# Patient Record
Sex: Female | Born: 1985 | Smoking: Current some day smoker
Health system: Southern US, Community
[De-identification: ages and names within clinical notes are randomized; demographics above are authoritative.]

## PROBLEM LIST (undated history)

## (undated) ENCOUNTER — Inpatient Hospital Stay (HOSPITAL_COMMUNITY): Payer: Self-pay

## (undated) DIAGNOSIS — F419 Anxiety disorder, unspecified: Secondary | ICD-10-CM

## (undated) DIAGNOSIS — F329 Major depressive disorder, single episode, unspecified: Secondary | ICD-10-CM

## (undated) DIAGNOSIS — F32A Depression, unspecified: Secondary | ICD-10-CM

## (undated) HISTORY — DX: Major depressive disorder, single episode, unspecified: F32.9

## (undated) HISTORY — DX: Depression, unspecified: F32.A

## (undated) HISTORY — DX: Anxiety disorder, unspecified: F41.9

---

## 2015-01-20 ENCOUNTER — Encounter: Payer: Self-pay | Admitting: *Deleted

## 2015-01-27 ENCOUNTER — Other Ambulatory Visit: Payer: Self-pay | Admitting: Advanced Practice Midwife

## 2015-01-27 ENCOUNTER — Encounter: Payer: Self-pay | Admitting: Advanced Practice Midwife

## 2015-01-27 ENCOUNTER — Ambulatory Visit (INDEPENDENT_AMBULATORY_CARE_PROVIDER_SITE_OTHER): Payer: Self-pay | Admitting: Advanced Practice Midwife

## 2015-01-27 VITALS — BP 116/72 | HR 94 | Wt 219.6 lb

## 2015-01-27 DIAGNOSIS — Z3483 Encounter for supervision of other normal pregnancy, third trimester: Secondary | ICD-10-CM

## 2015-01-27 DIAGNOSIS — O34219 Maternal care for unspecified type scar from previous cesarean delivery: Secondary | ICD-10-CM | POA: Insufficient documentation

## 2015-01-27 DIAGNOSIS — Z3493 Encounter for supervision of normal pregnancy, unspecified, third trimester: Secondary | ICD-10-CM | POA: Insufficient documentation

## 2015-01-27 DIAGNOSIS — Z832 Family history of diseases of the blood and blood-forming organs and certain disorders involving the immune mechanism: Secondary | ICD-10-CM

## 2015-01-27 DIAGNOSIS — Z23 Encounter for immunization: Secondary | ICD-10-CM

## 2015-01-27 DIAGNOSIS — O3421 Maternal care for scar from previous cesarean delivery: Secondary | ICD-10-CM

## 2015-01-27 DIAGNOSIS — F419 Anxiety disorder, unspecified: Secondary | ICD-10-CM | POA: Insufficient documentation

## 2015-01-27 DIAGNOSIS — Z8269 Family history of other diseases of the musculoskeletal system and connective tissue: Secondary | ICD-10-CM

## 2015-01-27 LAB — POCT URINALYSIS DIP (DEVICE)
BILIRUBIN URINE: NEGATIVE
GLUCOSE, UA: NEGATIVE mg/dL
HGB URINE DIPSTICK: NEGATIVE
Ketones, ur: NEGATIVE mg/dL
Nitrite: NEGATIVE
Protein, ur: NEGATIVE mg/dL
SPECIFIC GRAVITY, URINE: 1.02 (ref 1.005–1.030)
Urobilinogen, UA: 1 mg/dL (ref 0.0–1.0)
pH: 7 (ref 5.0–8.0)

## 2015-01-27 LAB — COMPREHENSIVE METABOLIC PANEL
ALBUMIN: 3.6 g/dL (ref 3.5–5.2)
ALK PHOS: 90 U/L (ref 39–117)
ALT: 14 U/L (ref 0–35)
AST: 15 U/L (ref 0–37)
BUN: 5 mg/dL — AB (ref 6–23)
CALCIUM: 9 mg/dL (ref 8.4–10.5)
CHLORIDE: 103 meq/L (ref 96–112)
CO2: 24 meq/L (ref 19–32)
Creat: 0.45 mg/dL — ABNORMAL LOW (ref 0.50–1.10)
Glucose, Bld: 95 mg/dL (ref 70–99)
Potassium: 3.8 mEq/L (ref 3.5–5.3)
Sodium: 138 mEq/L (ref 135–145)
TOTAL PROTEIN: 6.4 g/dL (ref 6.0–8.3)
Total Bilirubin: 0.3 mg/dL (ref 0.2–1.2)

## 2015-01-27 MED ORDER — TETANUS-DIPHTH-ACELL PERTUSSIS 5-2.5-18.5 LF-MCG/0.5 IM SUSP
0.5000 mL | Freq: Once | INTRAMUSCULAR | Status: AC
  Administered 2015-01-27: 0.5 mL via INTRAMUSCULAR

## 2015-01-27 NOTE — Patient Instructions (Signed)
Preterm Labor Information Preterm labor is when labor starts at less than 37 weeks of pregnancy. The normal length of a pregnancy is 39 to 41 weeks. CAUSES Often, there is no identifiable underlying cause as to why a woman goes into preterm labor. One of the most common known causes of preterm labor is infection. Infections of the uterus, cervix, vagina, amniotic sac, bladder, kidney, or even the lungs (pneumonia) can cause labor to start. Other suspected causes of preterm labor include:   Urogenital infections, such as yeast infections and bacterial vaginosis.   Uterine abnormalities (uterine shape, uterine septum, fibroids, or bleeding from the placenta).   A cervix that has been operated on (it may fail to stay closed).   Malformations in the fetus.   Multiple gestations (twins, triplets, and so on).   Breakage of the amniotic sac.  RISK FACTORS 1. Having a previous history of preterm labor.  2. Having premature rupture of membranes (PROM).  3. Having a placenta that covers the opening of the cervix (placenta previa).  4. Having a placenta that separates from the uterus (placental abruption).  5. Having a cervix that is too weak to hold the fetus in the uterus (incompetent cervix).  6. Having too much fluid in the amniotic sac (polyhydramnios).  7. Taking illegal drugs or smoking while pregnant.  8. Not gaining enough weight while pregnant.  9. Being younger than 18 and older than 29 years old.  10. Having a low socioeconomic status.  11. Being African American. SYMPTOMS Signs and symptoms of preterm labor include:   Menstrual-like cramps, abdominal pain, or back pain.  Uterine contractions that are regular, as frequent as six in an hour, regardless of their intensity (may be mild or painful).  Contractions that start on the top of the uterus and spread down to the lower abdomen and back.   A sense of increased pelvic pressure.   A watery or bloody mucus  discharge that comes from the vagina.  TREATMENT Depending on the length of the pregnancy and other circumstances, your health care provider may suggest bed rest. If necessary, there are medicines that can be given to stop contractions and to mature the fetal lungs. If labor happens before 34 weeks of pregnancy, a prolonged hospital stay may be recommended. Treatment depends on the condition of both you and the fetus.  WHAT SHOULD YOU DO IF YOU THINK YOU ARE IN PRETERM LABOR? Call your health care provider right away. You will need to go to the hospital to get checked immediately. HOW CAN YOU PREVENT PRETERM LABOR IN FUTURE PREGNANCIES? You should:   Stop smoking if you smoke.  Maintain healthy weight gain and avoid chemicals and drugs that are not necessary.  Be watchful for any type of infection.  Inform your health care provider if you have a known history of preterm labor. Document Released: 10/14/2003 Document Revised: 03/26/2013 Document Reviewed: 08/26/2012 ExitCare Patient Information 2015 [employer]. This information is not intended to replace advice given to you by your health care provider. Make sure you discuss any questions you have with your health care provider.  Fetal Movement Counts Patient Name: __________________________________________________ Patient Due Date: ____________________ Performing a fetal movement count is highly recommended in high-risk pregnancies, but it is good for every pregnant woman to do. Your health care provider may ask you to start counting fetal movements at 28 weeks of the pregnancy. Fetal movements often increase:  After eating a full meal.  After physical activity.    After eating or drinking something sweet or cold.  At rest. Pay attention to when you feel the baby is most active. This will help you notice a pattern of your baby's sleep and wake cycles and what factors contribute to an increase in fetal movement. It is important to  perform a fetal movement count at the same time each day when your baby is normally most active.  HOW TO COUNT FETAL MOVEMENTS 12. Find a quiet and comfortable area to sit or lie down on your left side. Lying on your left side provides the best blood and oxygen circulation to your baby. 13. Write down the day and time on a sheet of paper or in a journal. 14. Start counting kicks, flutters, swishes, rolls, or jabs in a 2-hour period. You should feel at least 10 movements within 2 hours. 15. If you do not feel 10 movements in 2 hours, wait 2-3 hours and count again. Look for a change in the pattern or not enough counts in 2 hours. SEEK MEDICAL CARE IF:  You feel less than 10 counts in 2 hours, tried twice.  There is no movement in over an hour.  The pattern is changing or taking longer each day to reach 10 counts in 2 hours.  You feel the baby is not moving as he or she usually does. Date: ____________ Movements: ____________ Start time: ____________ Finish time: ____________  Date: ____________ Movements: ____________ Start time: ____________ Finish time: ____________ Date: ____________ Movements: ____________ Start time: ____________ Finish time: ____________ Date: ____________ Movements: ____________ Start time: ____________ Finish time: ____________ Date: ____________ Movements: ____________ Start time: ____________ Finish time: ____________ Date: ____________ Movements: ____________ Start time: ____________ Finish time: ____________ Date: ____________ Movements: ____________ Start time: ____________ Finish time: ____________ Date: ____________ Movements: ____________ Start time: ____________ Finish time: ____________  Date: ____________ Movements: ____________ Start time: ____________ Finish time: ____________ Date: ____________ Movements: ____________ Start time: ____________ Finish time: ____________ Date: ____________ Movements: ____________ Start time: ____________ Finish time:  ____________ Date: ____________ Movements: ____________ Start time: ____________ Finish time: ____________ Date: ____________ Movements: ____________ Start time: ____________ Finish time: ____________ Date: ____________ Movements: ____________ Start time: ____________ Finish time: ____________ Date: ____________ Movements: ____________ Start time: ____________ Finish time: ____________  Date: ____________ Movements: ____________ Start time: ____________ Finish time: ____________ Date: ____________ Movements: ____________ Start time: ____________ Finish time: ____________ Date: ____________ Movements: ____________ Start time: ____________ Finish time: ____________ Date: ____________ Movements: ____________ Start time: ____________ Finish time: ____________ Date: ____________ Movements: ____________ Start time: ____________ Finish time: ____________ Date: ____________ Movements: ____________ Start time: ____________ Finish time: ____________ Date: ____________ Movements: ____________ Start time: ____________ Finish time: ____________  Date: ____________ Movements: ____________ Start time: ____________ Finish time: ____________ Date: ____________ Movements: ____________ Start time: ____________ Finish time: ____________ Date: ____________ Movements: ____________ Start time: ____________ Finish time: ____________ Date: ____________ Movements: ____________ Start time: ____________ Finish time: ____________ Date: ____________ Movements: ____________ Start time: ____________ Finish time: ____________ Date: ____________ Movements: ____________ Start time: ____________ Finish time: ____________ Date: ____________ Movements: ____________ Start time: ____________ Finish time: ____________  Date: ____________ Movements: ____________ Start time: ____________ Finish time: ____________ Date: ____________ Movements: ____________ Start time: ____________ Finish time: ____________ Date: ____________ Movements:  ____________ Start time: ____________ Finish time: ____________ Date: ____________ Movements: ____________ Start time: ____________ Finish time: ____________ Date: ____________ Movements: ____________ Start time: ____________ Finish time: ____________ Date: ____________ Movements: ____________ Start time: ____________ Finish time: ____________ Date: ____________ Movements: ____________ Start time: ____________ Finish time: ____________    Date: ____________ Movements: ____________ Start time: ____________ Finish time: ____________ Date: ____________ Movements: ____________ Start time: ____________ Finish time: ____________ Date: ____________ Movements: ____________ Start time: ____________ Finish time: ____________ Date: ____________ Movements: ____________ Start time: ____________ Finish time: ____________ Date: ____________ Movements: ____________ Start time: ____________ Finish time: ____________ Date: ____________ Movements: ____________ Start time: ____________ Finish time: ____________ Date: ____________ Movements: ____________ Start time: ____________ Finish time: ____________  Date: ____________ Movements: ____________ Start time: ____________ Finish time: ____________ Date: ____________ Movements: ____________ Start time: ____________ Finish time: ____________ Date: ____________ Movements: ____________ Start time: ____________ Finish time: ____________ Date: ____________ Movements: ____________ Start time: ____________ Finish time: ____________ Date: ____________ Movements: ____________ Start time: ____________ Finish time: ____________ Date: ____________ Movements: ____________ Start time: ____________ Finish time: ____________ Date: ____________ Movements: ____________ Start time: ____________ Finish time: ____________  Date: ____________ Movements: ____________ Start time: ____________ Finish time: ____________ Date: ____________ Movements: ____________ Start time: ____________ Finish  time: ____________ Date: ____________ Movements: ____________ Start time: ____________ Finish time: ____________ Date: ____________ Movements: ____________ Start time: ____________ Finish time: ____________ Date: ____________ Movements: ____________ Start time: ____________ Finish time: ____________ Date: ____________ Movements: ____________ Start time: ____________ Finish time: ____________ Document Released: 08/23/2006 Document Revised: 12/08/2013 Document Reviewed: 05/20/2012 ExitCare Patient Information 2015 [employer]. This information is not intended to replace advice given to you by your health care provider. Make sure you discuss any questions you have with your health care provider.  

## 2015-01-27 NOTE — Progress Notes (Signed)
Pt reports a creamy white discharge, no itching but sometimes has an odor. Patient also having pelvic pressure and hip and back pain. 1 hr today. [place] pap in [place], will request records.  Pt has history of depression and anxiety, not currently on meds. Had csection with last baby, is worried about pushing baby out and having an anxiety attack.

## 2015-01-27 NOTE — Progress Notes (Signed)
   Subjective:    Barbara Patterson is a G3P1011 [redacted]w[redacted]d being seen today for her first obstetrical visit.  Her obstetrical history is significant for previous cesarean, late tranfer of care. Plans TOLAC.  Stopped anxiety meds for pregnancy, but having Sx now. Declines meds. Patient does intend to breast feed. Pregnancy history fully reviewed. Has some prenatal records, but NOB labs not found.   Pap neg at previous provider per pt.   Patient reports no complaints.  Filed Vitals:   01/27/15 0753  BP: 116/72  Pulse: 94  Weight: 219 lb 9.6 oz (99.61 kg)    HISTORY: OB History  Gravida Para Term Preterm AB SAB TAB Ectopic Multiple Living  3 1 1  0 1 1    1     # Outcome Date GA Lbr Len/2nd Weight Sex Delivery Anes PTL Lv  3 Current           2 Term 12/27/00     CS-Unspec     1 SAB              Past Medical History  Diagnosis Date  . Anxiety   . Depression    Past Surgical History  Procedure Laterality Date  . Cesarean section     Family History  Problem Relation Age of Onset  . Diabetes Mother   . Kidney disease Mother   . Kidney disease Father   . Diabetes Sister   . Kidney disease Sister   . Kidney disease Brother   . Asthma Son   . Diabetes Maternal Grandmother   . Heart disease Paternal Grandmother      Exam    Uterus:   30 cm  Pelvic Exam: Deferred   Bony Pelvis: Unproven.  System: Breast:  normal appearance, no masses or tenderness   Skin: normal coloration and turgor, no rashes    Neurologic: oriented, gait normal; reflexes normal and symmetric, grossly non-focal, Anxious   Extremities: normal strength, tone, and muscle mass   HEENT sclera clear, anicteric and thyroid without masses   Mouth/Teeth mucous membranes moist, pharynx normal without lesions and dental hygiene good   Neck supple and no masses   Cardiovascular: regular rate and rhythm, no murmurs or gallops   Respiratory:  appears well, vitals normal, no respiratory distress, acyanotic, normal  RR, chest clear, no wheezing, crepitations, rhonchi, normal symmetric air entry   Abdomen: soft, non-tender; bowel sounds normal; no masses,  no organomegaly      Assessment:    Pregnancy: G3P1011 Patient Active Problem List   Diagnosis Date Noted  . History of cesarean delivery, antepartum 01/27/2015  . Supervision of normal pregnancy in third trimester 01/27/2015  . Anxiety 01/27/2015        Plan:     Initial labs drawn because labs not on prenatal records. Has Prenatal vitamins. Problem list reviewed and updated. Genetic Screening discussed Quad Screen: results reviewed. Neg  Ultrasound discussed; fetal survey: results reviewed.  Follow up in 2 weeks.  Dorathy Kinsman 01/27/2015

## 2015-01-28 ENCOUNTER — Telehealth: Payer: Self-pay | Admitting: *Deleted

## 2015-01-28 LAB — PRENATAL PROFILE (SOLSTAS)
Antibody Screen: NEGATIVE
BASOS ABS: 0 10*3/uL (ref 0.0–0.1)
BASOS PCT: 0 % (ref 0–1)
EOS ABS: 0.2 10*3/uL (ref 0.0–0.7)
Eosinophils Relative: 2 % (ref 0–5)
HEMATOCRIT: 30.3 % — AB (ref 36.0–46.0)
HEMOGLOBIN: 10.1 g/dL — AB (ref 12.0–15.0)
HIV: NONREACTIVE
Hepatitis B Surface Ag: NEGATIVE
Lymphocytes Relative: 20 % (ref 12–46)
Lymphs Abs: 1.9 10*3/uL (ref 0.7–4.0)
MCH: 27.4 pg (ref 26.0–34.0)
MCHC: 33.3 g/dL (ref 30.0–36.0)
MCV: 82.3 fL (ref 78.0–100.0)
MPV: 9.4 fL (ref 8.6–12.4)
Monocytes Absolute: 0.4 10*3/uL (ref 0.1–1.0)
Monocytes Relative: 4 % (ref 3–12)
NEUTROS PCT: 74 % (ref 43–77)
Neutro Abs: 7 10*3/uL (ref 1.7–7.7)
Platelets: 306 10*3/uL (ref 150–400)
RBC: 3.68 MIL/uL — ABNORMAL LOW (ref 3.87–5.11)
RDW: 14 % (ref 11.5–15.5)
Rh Type: POSITIVE
Rubella: 2.54 Index — ABNORMAL HIGH (ref <0.90)
WBC: 9.5 10*3/uL (ref 4.0–10.5)

## 2015-01-28 LAB — CULTURE, OB URINE

## 2015-01-28 LAB — GC/CHLAMYDIA PROBE AMP
CT Probe RNA: NEGATIVE
GC Probe RNA: NEGATIVE

## 2015-01-28 LAB — WET PREP, GENITAL
Trich, Wet Prep: NONE SEEN
Yeast Wet Prep HPF POC: NONE SEEN

## 2015-01-28 LAB — GLUCOSE TOLERANCE, 1 HOUR (50G) W/O FASTING: GLUCOSE 1 HOUR GTT: 105 mg/dL (ref 70–140)

## 2015-01-28 NOTE — Telephone Encounter (Signed)
Barbara Patterson called requesting results. [facility] and she wanted to know if any tests were positive. I  gave her results that were available , all negative, hiv pending. She voiced understanding.

## 2015-01-29 ENCOUNTER — Telehealth: Payer: Self-pay | Admitting: *Deleted

## 2015-01-29 MED ORDER — PRENATAL VITAMINS 28-0.8 MG PO TABS: 1.0000 | ORAL_TABLET | Freq: Every day | ORAL | Status: DC

## 2015-01-29 NOTE — Telephone Encounter (Signed)
Patient left voicemail message stating she didn't get her prescription when she was seen on 01/27/15.  States she is new to [facility] and doesn't know what pharmacy to use.  States she wants to pick up a printed prescription when she gets off work today at 3 pm.

## 2015-01-29 NOTE — Telephone Encounter (Signed)
Spoke with patient via phone.  Requests prescription for prenatal vitamin be sent to [facility] at [facility].  Prescription sent.

## 2015-01-31 LAB — CANNABANOIDS (GC/LC/MS), URINE: THC-COOH (GC/LC/MS), ur confirm: 69 ng/mL — AB (ref <5)

## 2015-02-02 ENCOUNTER — Encounter: Payer: Self-pay | Admitting: Advanced Practice Midwife

## 2015-02-02 DIAGNOSIS — F1290 Cannabis use, unspecified, uncomplicated: Secondary | ICD-10-CM | POA: Insufficient documentation

## 2015-02-02 LAB — PRESCRIPTION MONITORING PROFILE (19 PANEL)
AMPHETAMINE/METH: NEGATIVE ng/mL
BARBITURATE SCREEN, URINE: NEGATIVE ng/mL
BENZODIAZEPINE SCREEN, URINE: NEGATIVE ng/mL
Buprenorphine, Urine: NEGATIVE ng/mL
CREATININE, URINE: 142.24 mg/dL (ref >20.0)
Carisoprodol, Urine: NEGATIVE ng/mL
Cocaine Metabolites: NEGATIVE ng/mL
Fentanyl, Ur: NEGATIVE ng/mL
MDMA URINE: NEGATIVE ng/mL
Meperidine, Ur: NEGATIVE ng/mL
Methadone Screen, Urine: NEGATIVE ng/mL
Methaqualone: NEGATIVE ng/mL
NITRITES URINE, INITIAL: NEGATIVE ug/mL
OPIATE SCREEN, URINE: NEGATIVE ng/mL
OXYCODONE SCRN UR: NEGATIVE ng/mL
PH URINE, INITIAL: 7.4 pH (ref 4.5–8.9)
PROPOXYPHENE: NEGATIVE ng/mL
Phencyclidine, Ur: NEGATIVE ng/mL
TAPENTADOLUR: NEGATIVE ng/mL
TRAMADOL UR: NEGATIVE ng/mL
ZOLPIDEM, URINE: NEGATIVE ng/mL

## 2015-02-03 ENCOUNTER — Ambulatory Visit (INDEPENDENT_AMBULATORY_CARE_PROVIDER_SITE_OTHER): Payer: Self-pay | Admitting: Family

## 2015-02-03 VITALS — BP 113/61 | HR 87 | Temp 98.6°F | Wt 220.5 lb

## 2015-02-03 DIAGNOSIS — Z3493 Encounter for supervision of normal pregnancy, unspecified, third trimester: Secondary | ICD-10-CM

## 2015-02-03 DIAGNOSIS — Z3483 Encounter for supervision of other normal pregnancy, third trimester: Secondary | ICD-10-CM

## 2015-02-03 LAB — POCT URINALYSIS DIP (DEVICE)
Bilirubin Urine: NEGATIVE
GLUCOSE, UA: NEGATIVE mg/dL
Hgb urine dipstick: NEGATIVE
KETONES UR: NEGATIVE mg/dL
Nitrite: NEGATIVE
PROTEIN: NEGATIVE mg/dL
Specific Gravity, Urine: 1.02 (ref 1.005–1.030)
Urobilinogen, UA: 0.2 mg/dL (ref 0.0–1.0)
pH: 7 (ref 5.0–8.0)

## 2015-02-03 NOTE — Progress Notes (Signed)
Subjective:   Barbara Patterson  is a 29 y.o.  female being seen today for her obstetrical visit. She is at  8358w6d . Patient reports no concerns. Fetal movement: normal.  Undecided regarding TOLAC or repeat.  Concerned about uterine rupture.    Menstrual History: OB History    Gravida Para Term Preterm AB TAB SAB Ectopic Multiple Living   1               The following portions of the patient's history were reviewed and updated as appropriate: allergies, current medications, past family history, past medical history, past social history, past surgical history and problem list.  Review of Systems Constitutional: negative Genitourinary:negative; pt denies vaginal bleeding or leaking of fluid.  Reports no contractions.     Objective:   Filed Vitals:   02/03/15 0742  BP: 113/61  Pulse: 87  Temp: 98.6 F (37 C)    FHT:  142 BPM  Uterine Size: 32 cm         Assessment:   G3P1011  at 3358w6d wks   Plan:    28-week labs reviewed, normal discussed tolac versus csection.  Given consent to review. Follow up in 2 Weeks.

## 2015-02-03 NOTE — Progress Notes (Signed)
Reviewed tip of week with patient  

## 2015-02-13 ENCOUNTER — Encounter (HOSPITAL_COMMUNITY): Payer: Self-pay | Admitting: *Deleted

## 2015-02-13 ENCOUNTER — Inpatient Hospital Stay (HOSPITAL_COMMUNITY)
Admission: AD | Admit: 2015-02-13 | Discharge: 2015-02-13 | Disposition: A | Discharge status: Home / Self Care | Payer: Medicaid - Out of State | Source: Ambulatory Visit | Attending: Obstetrics and Gynecology | Admitting: Obstetrics and Gynecology

## 2015-02-13 DIAGNOSIS — R102 Pelvic and perineal pain: Secondary | ICD-10-CM | POA: Diagnosis not present

## 2015-02-13 DIAGNOSIS — Z87891 Personal history of nicotine dependence: Secondary | ICD-10-CM | POA: Insufficient documentation

## 2015-02-13 DIAGNOSIS — O9989 Other specified diseases and conditions complicating pregnancy, childbirth and the puerperium: Secondary | ICD-10-CM | POA: Insufficient documentation

## 2015-02-13 DIAGNOSIS — O26899 Other specified pregnancy related conditions, unspecified trimester: Secondary | ICD-10-CM

## 2015-02-13 DIAGNOSIS — R109 Unspecified abdominal pain: Secondary | ICD-10-CM

## 2015-02-13 DIAGNOSIS — Z3A33 33 weeks gestation of pregnancy: Secondary | ICD-10-CM | POA: Diagnosis not present

## 2015-02-13 LAB — URINALYSIS, ROUTINE W REFLEX MICROSCOPIC
Bilirubin Urine: NEGATIVE
Glucose, UA: NEGATIVE mg/dL
HGB URINE DIPSTICK: NEGATIVE
Ketones, ur: NEGATIVE mg/dL
Leukocytes, UA: NEGATIVE
NITRITE: NEGATIVE
PH: 5.5 (ref 5.0–8.0)
Protein, ur: NEGATIVE mg/dL
Specific Gravity, Urine: 1.015 (ref 1.005–1.030)
UROBILINOGEN UA: 0.2 mg/dL (ref 0.0–1.0)

## 2015-02-13 NOTE — MAU Note (Signed)
Patient presents at 8133 weeks gestation with c/o abdominal pain since yesterday. Fetus active. Denies bleeding or discharge.

## 2015-02-13 NOTE — Discharge Instructions (Signed)

## 2015-02-13 NOTE — MAU Provider Note (Signed)
  History     CSN: [id number]  Arrival date and time: 02/13/15 [id number]   None     Chief Complaint  Patient presents with  . Abdominal Pain   HPI  [name] @[redacted]w[redacted]d  presents to the MAU stating that she is having pain in her lower abdomen extending to groin especially when she is walking. Denies vaginal bleeding, LOF. Endorses good fetal movement.   Past Medical History  Diagnosis Date  . Anxiety   . Depression     Past Surgical History  Procedure Laterality Date  . Cesarean section      Family History  Problem Relation Age of Onset  . Diabetes Mother   . Kidney disease Mother   . Kidney disease Father   . Diabetes Sister   . Kidney disease Sister   . Kidney disease Brother   . Asthma Son   . Diabetes Maternal Grandmother   . Heart disease Paternal Grandmother     History  Substance Use Topics  . Smoking status: Former Games developermoker  . Smokeless tobacco: Not on file  . Alcohol Use: No    Allergies: No Known Allergies  Prescriptions prior to admission  Medication Sig Dispense Refill Last Dose  . Prenatal Vit-Fe Fumarate-FA (PRENATAL VITAMINS) 28-0.8 MG TABS Take 1 tablet by mouth daily. 30 tablet 12 Past Week at Unknown time    Review of Systems  Constitutional: Negative for fever.  Gastrointestinal: Positive for abdominal pain.  All other systems reviewed and are negative.  Physical Exam   Blood pressure 102/73, pulse 97, temperature 97.8 F (36.6 C), temperature source Oral, resp. rate 120, height 5\' 7"  (1.702 m), weight 99.791 kg (220 lb), last menstrual period 06/25/2014.  Physical Exam  Nursing note and vitals reviewed. Constitutional: She is oriented to person, place, and time. She appears well-developed and well-nourished. No distress.  HENT:  Head: Normocephalic.  Neck: Normal range of motion.  Cardiovascular: Normal rate.   Respiratory: Effort normal. No respiratory distress.  GI: Soft. There is no tenderness.  Musculoskeletal: Normal range of  motion.  Neurological: She is alert and oriented to person, place, and time.  Skin: Skin is warm and dry.  Psychiatric: She has a normal mood and affect. Her behavior is normal. Judgment and thought content normal.   Results for orders placed or performed during the hospital encounter of 02/13/15 (from the past 24 hour(s))  Urinalysis, Routine w reflex microscopic (not at [facility])     Status: None   Collection Time: 02/13/15  9:10 AM  Result Value Ref Range   Color, Urine YELLOW YELLOW   APPearance CLEAR CLEAR   Specific Gravity, Urine 1.015 1.005 - 1.030   pH 5.5 5.0 - 8.0   Glucose, UA NEGATIVE NEGATIVE mg/dL   Hgb urine dipstick NEGATIVE NEGATIVE   Bilirubin Urine NEGATIVE NEGATIVE   Ketones, ur NEGATIVE NEGATIVE mg/dL   Protein, ur NEGATIVE NEGATIVE mg/dL   Urobilinogen, UA 0.2 0.0 - 1.0 mg/dL   Nitrite NEGATIVE NEGATIVE   Leukocytes, UA NEGATIVE NEGATIVE   MAU Course  Procedures  MDM Pending UA; EFM; Pt will be discharged to home with preterm  labor precautions  Assessment and Plan  Round ligament Pain  Discharge to home  [facility],Barbara Patterson 02/13/2015, 10:17 AM

## 2015-02-17 ENCOUNTER — Ambulatory Visit (INDEPENDENT_AMBULATORY_CARE_PROVIDER_SITE_OTHER): Payer: Self-pay | Admitting: Certified Nurse Midwife

## 2015-02-17 VITALS — BP 115/70 | HR 87 | Temp 98.1°F | Wt 218.3 lb

## 2015-02-17 DIAGNOSIS — Z3493 Encounter for supervision of normal pregnancy, unspecified, third trimester: Secondary | ICD-10-CM

## 2015-02-17 DIAGNOSIS — Z3483 Encounter for supervision of other normal pregnancy, third trimester: Secondary | ICD-10-CM

## 2015-02-17 LAB — POCT URINALYSIS DIP (DEVICE)
Bilirubin Urine: NEGATIVE
Glucose, UA: NEGATIVE mg/dL
Hgb urine dipstick: NEGATIVE
Ketones, ur: NEGATIVE mg/dL
Nitrite: NEGATIVE
Protein, ur: NEGATIVE mg/dL
Specific Gravity, Urine: 1.015 (ref 1.005–1.030)
Urobilinogen, UA: 0.2 mg/dL (ref 0.0–1.0)
pH: 7 (ref 5.0–8.0)

## 2015-02-17 NOTE — Patient Instructions (Signed)
Preterm Birth °Preterm birth is a birth that happens before 37 weeks of pregnancy. Most pregnancies last about 39-41 weeks. Every week in the womb is important and is beneficial to the health of the infant. Infants born before 37 weeks of pregnancy are at a higher risk for complications. Depending on when the infant was born, he or she may be: °· Late preterm. Born between 32 weeks and 37 weeks of pregnancy. °· Very preterm. Born at less than 32 weeks of pregnancy. °· Extremely preterm. Born at less than 25 weeks of pregnancy. °The earlier a baby is born, the more likely the child will have issues related to prematurity. Complications and problems that can be seen in infants born too early include: °· Problems breathing (respiratory distress syndrome). °· Low birth weight. °· Problems feeding. °· Sleeping problems. °· Yellowing of the skin (jaundice). °· Infections such as pneumonia.  °Babies born very preterm or extremely preterm are at risk for more serious medical issues. These include: °· More severe breathing issues. °· Eyesight issues. °· Brain development issues (intraventricular hemorrhage). °· Behavioral and emotional development issues. °· Growth and developmental delays. °· Cerebral palsy. °· Serious feeding or bowel complications (necrotizing enterocolitis). °CAUSES  °There are two broad categories of preterm birth. °· Spontaneous preterm birth. This is a birth resulting from preterm labor (not medically induced) or preterm premature rupture of membranes (PPROM). °· Indicated preterm birth. This is a birth resulting from labor being medically induced due to health, personal, or social reasons. °RISK FACTORS °Preterm birth may be related to certain medical conditions, lifestyle factors, or demographic factors encountered by the mother or fetus. °· Medical conditions include: °¨ Multiple gestations (twins, triplets, and so on). °¨ Infection. °¨ Diabetes. °¨ Heart disease. °¨ Kidney disease. °¨ Cervical or  uterine abnormalities. °¨ Being underweight. °¨ High blood pressure or preeclampsia. °¨ Premature rupture of membranes (PROM). °¨ Birth defects in the fetus. °· Lifestyle factors include: °¨ Poor prenatal care. °¨ Poor nutrition or anemia. °¨ Cigarette smoking. °¨ Consuming alcohol. °¨ High levels of stress and lack of social or emotional support. °¨ Exposure to chemical or environmental toxins. °¨ Substance abuse. °· Demographic factors include: °¨ African-American ethnicity. °¨ Age (younger than 18 or older than 29 years of age). °¨ Low socioeconomic status. °Women with a history of preterm labor or who become pregnant within 18 months of giving birth are also at increased risk for preterm birth. °DIAGNOSIS  °Your health care provider may request additional tests to diagnose underlying complications resulting from preterm birth. Tests on the infant may include: °· Physical exam. °· Blood tests. °· Chest X-rays. °· Heart-lung monitoring. °TREATMENT  °After birth, special care will be taken to assess any problems or complications for the infant. Supportive care will be provided for the infant. Treatment depends on what problems are present and any complications that develop. Some preterm infants are cared for in a neonatal intensive care unit. In general, care may include: °· Maintaining temperature and oxygen in a clear heated box (baby isolette). °· Monitoring the infant's heart rate, breathing, and level of oxygen in the blood. °· Monitoring for signs of infection and, if needed, giving IV antibiotic medicine. °· Inserting a feeding tube (nose, mouth) or giving IV nutrition if unable to feed. °· Inserting a breathing tube (ventilation). °· Respiration support (continuous positive airway pressure [CPAP] or oxygen).  °Treatment will change as the infant builds up strength and is able to breathe and eat on his or her   own. For some infants, no special treatment is necessary. Parents may be educated on the potential  health risks of prematurity to the infant. °HOME CARE INSTRUCTIONS °· Understand your infant's special conditions and needs. It may be reassuring to learn about infant CPR. °· Monitor your infant in the car seat until he or she grows and matures. Infant car seats can cause breathing difficulties for preterm infants. °· Keep your infant warm. Dress your infant in layers and keep him or her away from drafts, especially in cold months of the year. °· Wash your hands thoroughly after going to the bathroom or changing a diaper. Late preterm infants may be more prone to infection. °· Follow all your health care provider's instructions for providing support and care to your preterm infant. °· Get support from organizations and groups that understand your challenges. °· Follow up with your infant's health care provider as directed. °Prevention °There are some things you can do to help lower your risk of having a preterm infant in the future. These include: °· Good prenatal care throughout the entire pregnancy. See a health care provider regularly for advice and tests. °· Management of underlying medical conditions. °· Proper self-care and lifestyle changes. °· Proper diet and weight control. °· Watching for signs of various infections. °SEEK MEDICAL CARE IF: °· Your infant has feeding difficulties. °· Your infant has sleeping difficulties. °· Your infant has breathing difficulties. °· Your infant's skin starts to look yellow. °· Your infant shows signs of infection, such as a stuffy nose, fever, crying, or bluish color of the skin. °FOR MORE INFORMATION °March of D[employer]: www.marchofdimes.com °Prematurity.org: www.prematurity.org °Document Released: 10/14/2003 Document Revised: 05/14/2013 Document Reviewed: 02/20/2013 °ExitCare® Patient Information ©2015 [employer]. This information is not intended to replace advice given to you by your health care provider. Make sure you discuss any questions you have with your health  care provider. ° °

## 2015-02-17 NOTE — Progress Notes (Signed)
Subjective:  Barbara Patterson is a [name] at 3961w6d being seen today for ongoing prenatal care.  Patient reports no complaints and round ligament pain.  Contractions: Irritability.  Vag. Bleeding: None. Movement: Present. Denies leaking of fluid.   The following portions of the patient's history were reviewed and updated as appropriate: allergies, current medications, past family history, past medical history, past social history, past surgical history and problem list. Encouraged to take breaks at work if possible  Objective:   Filed Vitals:   02/17/15 0829  BP: 115/70  Pulse: 87  Temp: 98.1 F (36.7 C)  Weight: 218 lb 4.8 oz (99.02 kg)    Fetal Status:     Movement: Present     General:  Alert, oriented and cooperative. Patient is in no acute distress.  Skin: Skin is warm and dry. No rash noted.   Cardiovascular: Normal heart rate noted  Respiratory: Normal respiratory effort, no problems with respiration noted  Abdomen: Soft, gravid, appropriate for gestational age. Pain/Pressure: Present     Vaginal: Vag. Bleeding: None.       Cervix: Not evaluated        Extremities: Normal range of motion.  Edema: None  Mental Status: Normal mood and affect. Normal behavior. Normal judgment and thought content.   Urinalysis:      Assessment and Plan:  Pregnancy: G3P1011 at 3261w6d  There are no diagnoses linked to this encounter.  Preterm labor symptoms and general obstetric precautions including but not limited to vaginal bleeding, contractions, leaking of fluid and fetal movement were reviewed in detail with the patient.  Please refer to After Visit Summary for other counseling recommendations.   No Follow-up on file.   Barbara Patterson, CNM

## 2015-02-17 NOTE — Progress Notes (Signed)
Breastfeeding tip of the week reviewed. 

## 2015-02-21 ENCOUNTER — Inpatient Hospital Stay (HOSPITAL_COMMUNITY)
Admission: AD | Admit: 2015-02-21 | Discharge: 2015-02-21 | Disposition: A | Discharge status: Home / Self Care | Payer: Medicaid Other | Source: Ambulatory Visit | Attending: Obstetrics & Gynecology | Admitting: Obstetrics & Gynecology

## 2015-02-21 ENCOUNTER — Encounter (HOSPITAL_COMMUNITY): Payer: Self-pay | Admitting: *Deleted

## 2015-02-21 DIAGNOSIS — O9989 Other specified diseases and conditions complicating pregnancy, childbirth and the puerperium: Secondary | ICD-10-CM | POA: Diagnosis not present

## 2015-02-21 DIAGNOSIS — Z87891 Personal history of nicotine dependence: Secondary | ICD-10-CM | POA: Diagnosis not present

## 2015-02-21 DIAGNOSIS — Z3A34 34 weeks gestation of pregnancy: Secondary | ICD-10-CM | POA: Insufficient documentation

## 2015-02-21 DIAGNOSIS — R51 Headache: Secondary | ICD-10-CM

## 2015-02-21 DIAGNOSIS — O26893 Other specified pregnancy related conditions, third trimester: Secondary | ICD-10-CM

## 2015-02-21 DIAGNOSIS — O212 Late vomiting of pregnancy: Secondary | ICD-10-CM | POA: Diagnosis present

## 2015-02-21 LAB — COMPREHENSIVE METABOLIC PANEL
ALBUMIN: 3 g/dL — AB (ref 3.5–5.0)
ALT: 21 U/L (ref 14–54)
AST: 22 U/L (ref 15–41)
Alkaline Phosphatase: 101 U/L (ref 38–126)
Anion gap: 6 (ref 5–15)
BUN: <5 mg/dL — ABNORMAL LOW (ref 6–20)
CO2: 22 mmol/L (ref 22–32)
CREATININE: 0.43 mg/dL — AB (ref 0.44–1.00)
Calcium: 8.7 mg/dL — ABNORMAL LOW (ref 8.9–10.3)
Chloride: 108 mmol/L (ref 101–111)
GFR calc Af Amer: >60 mL/min (ref >60)
GFR calc non Af Amer: >60 mL/min (ref >60)
Glucose, Bld: 84 mg/dL (ref 65–99)
Potassium: 3.7 mmol/L (ref 3.5–5.1)
Sodium: 136 mmol/L (ref 135–145)
TOTAL PROTEIN: 7 g/dL (ref 6.5–8.1)
Total Bilirubin: 0.3 mg/dL (ref 0.3–1.2)

## 2015-02-21 LAB — URINALYSIS, ROUTINE W REFLEX MICROSCOPIC
BILIRUBIN URINE: NEGATIVE
Glucose, UA: NEGATIVE mg/dL
Hgb urine dipstick: NEGATIVE
KETONES UR: NEGATIVE mg/dL
Leukocytes, UA: NEGATIVE
NITRITE: NEGATIVE
Protein, ur: NEGATIVE mg/dL
Specific Gravity, Urine: 1.015 (ref 1.005–1.030)
UROBILINOGEN UA: 0.2 mg/dL (ref 0.0–1.0)
pH: 7 (ref 5.0–8.0)

## 2015-02-21 LAB — CBC
HCT: 29.4 % — ABNORMAL LOW (ref 36.0–46.0)
HEMOGLOBIN: 9.5 g/dL — AB (ref 12.0–15.0)
MCH: 27.5 pg (ref 26.0–34.0)
MCHC: 32.3 g/dL (ref 30.0–36.0)
MCV: 85.2 fL (ref 78.0–100.0)
Platelets: 300 10*3/uL (ref 150–400)
RBC: 3.45 MIL/uL — ABNORMAL LOW (ref 3.87–5.11)
RDW: 14.4 % (ref 11.5–15.5)
WBC: 8.4 10*3/uL (ref 4.0–10.5)

## 2015-02-21 LAB — PROTEIN / CREATININE RATIO, URINE
Creatinine, Urine: 66 mg/dL
PROTEIN CREATININE RATIO: 0.12 mg/mg{creat} (ref 0.00–0.15)
TOTAL PROTEIN, URINE: 8 mg/dL

## 2015-02-21 MED ORDER — PROMETHAZINE HCL 25 MG/ML IJ SOLN
12.5000 mg | Freq: Once | INTRAMUSCULAR | Status: AC
  Administered 2015-02-21: 12.5 mg via INTRAVENOUS
  Filled 2015-02-21: qty 1

## 2015-02-21 MED ORDER — METOCLOPRAMIDE HCL 5 MG/ML IJ SOLN
10.0000 mg | Freq: Once | INTRAMUSCULAR | Status: AC
  Administered 2015-02-21: 10 mg via INTRAVENOUS
  Filled 2015-02-21: qty 2

## 2015-02-21 MED ORDER — METOCLOPRAMIDE HCL 10 MG PO TABS: 10.0000 mg | ORAL_TABLET | Freq: Four times a day (QID) | ORAL | Status: DC

## 2015-02-21 MED ORDER — DEXAMETHASONE SODIUM PHOSPHATE 10 MG/ML IJ SOLN
10.0000 mg | Freq: Once | INTRAMUSCULAR | Status: AC
  Administered 2015-02-21: 10 mg via INTRAVENOUS
  Filled 2015-02-21: qty 1

## 2015-02-21 MED ORDER — LACTATED RINGERS IV BOLUS (SEPSIS)
1000.0000 mL | Freq: Once | INTRAVENOUS | Status: AC
  Administered 2015-02-21: 1000 mL via INTRAVENOUS

## 2015-02-21 NOTE — MAU Note (Signed)
Pt states here with vomiting x2 days. Denies diarrhea. No abnormal discharge or bleeding. Also has headache.

## 2015-02-21 NOTE — MAU Provider Note (Signed)
History     CSN: [id number]  Arrival date and time: 02/21/15 4098   First Provider Initiated Contact with Patient 02/21/15 [phone]      Chief Complaint  Patient presents with  . Emesis   HPI [name] at [redacted]w[redacted]d w/ headache since Wednesday, no improvement with Tylenol at home, also one episode of vomiting daily for the past 3 days. + scotoma and photosensitivity, no abdominal pain, + fetal movement. No h/o headaches, uncomplicated prenatal course.   Past Medical History  Diagnosis Date  . Anxiety   . Depression     Past Surgical History  Procedure Laterality Date  . Cesarean section      Family History  Problem Relation Age of Onset  . Diabetes Mother   . Kidney disease Mother   . Kidney disease Father   . Diabetes Sister   . Kidney disease Sister   . Kidney disease Brother   . Asthma Son   . Diabetes Maternal Grandmother   . Heart disease Paternal Grandmother     History  Substance Use Topics  . Smoking status: Former Games developer  . Smokeless tobacco: Not on file  . Alcohol Use: No    Allergies: No Known Allergies  Prescriptions prior to admission  Medication Sig Dispense Refill Last Dose  . acetaminophen (TYLENOL) 500 MG tablet Take 1,000 mg by mouth every 6 (six) hours as needed for mild pain or headache.   02/20/2015 at Unknown time  . calcium carbonate (TUMS - DOSED IN MG ELEMENTAL CALCIUM) 500 MG chewable tablet Chew 1 tablet by mouth daily.   02/20/2015 at Unknown time  . Prenatal Vit-Fe Fumarate-FA (PRENATAL VITAMINS) 28-0.8 MG TABS Take 1 tablet by mouth daily. 30 tablet 12 02/20/2015 at Unknown time    Review of Systems  Constitutional: Negative.   Eyes:       + scotoma  Respiratory: Negative.   Cardiovascular: Negative.   Gastrointestinal: Positive for nausea and vomiting. Negative for abdominal pain, diarrhea and constipation.  Genitourinary: Negative for dysuria, urgency, frequency, hematuria and flank pain.       Negative for vaginal bleeding,  cramping/contractions  Musculoskeletal: Negative.   Neurological: Positive for headaches.  Psychiatric/Behavioral: Negative.    Physical Exam   Blood pressure 114/70, pulse 93, temperature 98.1 F (36.7 C), temperature source Oral, resp. rate 18, height  (1.702 m), weight 220 lb (99.791 kg), last menstrual period 06/25/2014.  Physical Exam  Nursing note and vitals reviewed. Constitutional: She is oriented to person, place, and time. She appears well-developed and well-nourished. No distress.  Cardiovascular: Normal rate.   GI: Soft. There is no tenderness.  Musculoskeletal: She exhibits no edema.  Neurological: She is alert and oriented to person, place, and time. She has normal reflexes. No cranial nerve deficit.  Neg clonus   Skin: Skin is warm and dry.  Psychiatric: She has a normal mood and affect.   FHR 140s, mod variability, + accels, no decels TOCO: UI MAU Course  Procedures  Patient Vitals for the past 24 hrs:  BP Temp Temp src Pulse Resp Height Weight  02/21/15 0821 114/70 mmHg 98.1 F (36.7 C) Oral 93 18 - -  02/21/15 0802 - - - - -  (1.702 m) 220 lb (99.791 kg)  \   Results for orders placed or performed during the hospital encounter of 02/21/15 (from the past 24 hour(s))  Urinalysis, Routine w reflex microscopic (not at [facility])     Status: None  Collection Time: 02/21/15  7:55 AM  Result Value Ref Range   Color, Urine YELLOW YELLOW   APPearance CLEAR CLEAR   Specific Gravity, Urine 1.015 1.005 - 1.030   pH 7.0 5.0 - 8.0   Glucose, UA NEGATIVE NEGATIVE mg/dL   Hgb urine dipstick NEGATIVE NEGATIVE   Bilirubin Urine NEGATIVE NEGATIVE   Ketones, ur NEGATIVE NEGATIVE mg/dL   Protein, ur NEGATIVE NEGATIVE mg/dL   Urobilinogen, UA 0.2 0.0 - 1.0 mg/dL   Nitrite NEGATIVE NEGATIVE   Leukocytes, UA NEGATIVE NEGATIVE  Protein / creatinine ratio, urine     Status: None   Collection Time: 02/21/15  8:08 AM  Result Value Ref Range   Creatinine, Urine 66.00  mg/dL   Total Protein, Urine 8 mg/dL   Protein Creatinine Ratio 0.12 0.00 - 0.15 mg/mg[Cre]  CBC     Status: Abnormal   Collection Time: 02/21/15  8:34 AM  Result Value Ref Range   WBC 8.4 4.0 - 10.5 K/uL   RBC 3.45 (L) 3.87 - 5.11 MIL/uL   Hemoglobin 9.5 (L) 12.0 - 15.0 g/dL   HCT 40.929.4 (L) 81.136.0 - 91.446.0 %   MCV 85.2 78.0 - 100.0 fL   MCH 27.5 26.0 - 34.0 pg   MCHC 32.3 30.0 - 36.0 g/dL   RDW 78.214.4 95.611.5 - 21.315.5 %   Platelets 300 150 - 400 K/uL  Comprehensive metabolic panel     Status: Abnormal   Collection Time: 02/21/15  8:34 AM  Result Value Ref Range   Sodium 136 135 - 145 mmol/L   Potassium 3.7 3.5 - 5.1 mmol/L   Chloride 108 101 - 111 mmol/L   CO2 22 22 - 32 mmol/L   Glucose, Bld 84 65 - 99 mg/dL   BUN <5 (L) 6 - 20 mg/dL   Creatinine, Ser 0.860.43 (L) 0.44 - 1.00 mg/dL   Calcium 8.7 (L) 8.9 - 10.3 mg/dL   Total Protein 7.0 6.5 - 8.1 g/dL   Albumin 3.0 (L) 3.5 - 5.0 g/dL   AST 22 15 - 41 U/L   ALT 21 14 - 54 U/L   Alkaline Phosphatase 101 38 - 126 U/L   Total Bilirubin 0.3 0.3 - 1.2 mg/dL   GFR calc non Af Amer >60 >60 mL/min   GFR calc Af Amer >60 >60 mL/min   Anion gap 6 5 - 15   LR bolus w/ phenergan 12.5 mg, Decadron 10 mg and Reglan 10 mg for headache and nausea. Pt reports good relief of headache and nausea.   Assessment and Plan   1. Headache in pregnancy, antepartum, third trimester   Resolved w/ IV migraine cocktail, PIH labs and BP all WNL, cat 1 fetal tracing. Precautions rev'd, reglan rx provided for nausea and headache if needed, f/u as scheduled or sooner PRN.     Medication List    TAKE these medications        acetaminophen 500 MG tablet  Commonly known as:  TYLENOL  Take 1,000 mg by mouth every 6 (six) hours as needed for mild pain or headache.     calcium carbonate 500 MG chewable tablet  Commonly known as:  TUMS - dosed in mg elemental calcium  Chew 1 tablet by mouth daily.     metoCLOPramide 10 MG tablet  Commonly known as:  REGLAN  Take 1  tablet (10 mg total) by mouth 4 (four) times daily.     Prenatal Vitamins 28-0.8 MG Tabs  Take 1 tablet by  mouth daily.        Follow-up Information    Follow up with [facility].   Specialty:  Obstetrics and Gynecology   Why:  as scheduled or sooner as needed   Contact information:   [address] [place] [address] [phone]        Gordana Kewley 02/21/2015, 10:34 AM

## 2015-03-03 ENCOUNTER — Ambulatory Visit (INDEPENDENT_AMBULATORY_CARE_PROVIDER_SITE_OTHER): Payer: Self-pay | Admitting: Advanced Practice Midwife

## 2015-03-03 ENCOUNTER — Other Ambulatory Visit: Payer: Self-pay | Admitting: Advanced Practice Midwife

## 2015-03-03 VITALS — BP 112/70 | HR 95 | Temp 98.5°F | Wt 220.7 lb

## 2015-03-03 DIAGNOSIS — Z3493 Encounter for supervision of normal pregnancy, unspecified, third trimester: Secondary | ICD-10-CM

## 2015-03-03 DIAGNOSIS — Z3483 Encounter for supervision of other normal pregnancy, third trimester: Secondary | ICD-10-CM

## 2015-03-03 DIAGNOSIS — O2343 Unspecified infection of urinary tract in pregnancy, third trimester: Secondary | ICD-10-CM

## 2015-03-03 LAB — POCT URINALYSIS DIP (DEVICE)
Bilirubin Urine: NEGATIVE
GLUCOSE, UA: NEGATIVE mg/dL
Hgb urine dipstick: NEGATIVE
Ketones, ur: NEGATIVE mg/dL
Nitrite: NEGATIVE
Protein, ur: NEGATIVE mg/dL
SPECIFIC GRAVITY, URINE: 1.015 (ref 1.005–1.030)
Urobilinogen, UA: 1 mg/dL (ref 0.0–1.0)
pH: 5.5 (ref 5.0–8.0)

## 2015-03-03 LAB — OB RESULTS CONSOLE GBS: GBS: NEGATIVE

## 2015-03-03 LAB — OB RESULTS CONSOLE GC/CHLAMYDIA
CHLAMYDIA, DNA PROBE: NEGATIVE
Gonorrhea: NEGATIVE

## 2015-03-03 MED ORDER — NITROFURANTOIN MONOHYD MACRO 100 MG PO CAPS: 100.0000 mg | ORAL_CAPSULE | Freq: Two times a day (BID) | ORAL | Status: DC

## 2015-03-03 NOTE — Progress Notes (Signed)
Subjective:  Barbara Patterson is a [name] at [redacted]w[redacted]d being seen today for ongoing prenatal care.  Patient reports backache and urinary urgency/frequency.  Contractions: Irritability.  Vag. Bleeding: None. Movement: Present. Denies leaking of fluid.   The following portions of the patient's history were reviewed and updated as appropriate: allergies, current medications, past family history, past medical history, past social history, past surgical history and problem list.   Objective:   Filed Vitals:   03/03/15 1058  BP: 112/70  Pulse: 95  Temp: 98.5 F (36.9 C)  Weight: 220 lb 11.2 oz (100.109 kg)    Fetal Status: Fetal Heart Rate (bpm): 141 Fundal Height: 36 cm Movement: Present  Presentation: Vertex  General:  Alert, oriented and cooperative. Patient is in no acute distress.  Skin: Skin is warm and dry. No rash noted.   Cardiovascular: Normal heart rate noted  Respiratory: Normal respiratory effort, no problems with respiration noted  Abdomen: Soft, gravid, appropriate for gestational age. Pain/Pressure: Absent     Vaginal: Vag. Bleeding: None.       Cervix: Exam revealed Dilation: Fingertip Effacement (%): 50 Station: -3  Extremities: Normal range of motion.  Edema: None  Mental Status: Normal mood and affect. Normal behavior. Normal judgment and thought content.   Urinalysis: Urine Protein: Negative Urine Glucose: Negative  Assessment and Plan:  Pregnancy: G3P1011 at [redacted]w[redacted]d  1. Supervision of normal pregnancy in third trimester  - Culture, beta strep (group b only) - GC/Chlamydia Probe Amp - Culture, OB Urine 2.  UTI in pregnancy r/t frequency, back and abdominal pain  -  Macrobid 100 mg BID x 7 days  -  Urine sent for culture  Term labor symptoms and general obstetric precautions including but not limited to vaginal bleeding, contractions, leaking of fluid and fetal movement were reviewed in detail with the patient.  Please refer to After Visit Summary for  other counseling recommendations.  Return in about 1 week (around 03/10/2015).   Hurshel Party, CNM

## 2015-03-04 LAB — GC/CHLAMYDIA PROBE AMP
CT Probe RNA: NEGATIVE
GC Probe RNA: NEGATIVE

## 2015-03-05 ENCOUNTER — Encounter: Payer: Self-pay | Admitting: *Deleted

## 2015-03-05 LAB — CULTURE, OB URINE: Colony Count: 50000

## 2015-03-05 LAB — CULTURE, BETA STREP (GROUP B ONLY)

## 2015-03-09 ENCOUNTER — Encounter: Payer: Self-pay | Admitting: Obstetrics and Gynecology

## 2015-03-12 ENCOUNTER — Ambulatory Visit (INDEPENDENT_AMBULATORY_CARE_PROVIDER_SITE_OTHER): Payer: Medicaid Other | Admitting: Obstetrics and Gynecology

## 2015-03-12 VITALS — BP 98/63 | HR 83 | Temp 97.6°F | Wt 222.2 lb

## 2015-03-12 DIAGNOSIS — Z3493 Encounter for supervision of normal pregnancy, unspecified, third trimester: Secondary | ICD-10-CM

## 2015-03-12 DIAGNOSIS — Z3483 Encounter for supervision of other normal pregnancy, third trimester: Secondary | ICD-10-CM

## 2015-03-12 LAB — POCT URINALYSIS DIP (DEVICE)
BILIRUBIN URINE: NEGATIVE
Glucose, UA: NEGATIVE mg/dL
HGB URINE DIPSTICK: NEGATIVE
Ketones, ur: NEGATIVE mg/dL
NITRITE: NEGATIVE
Protein, ur: NEGATIVE mg/dL
Specific Gravity, Urine: 1.015 (ref 1.005–1.030)
Urobilinogen, UA: 0.2 mg/dL (ref 0.0–1.0)
pH: 5.5 (ref 5.0–8.0)

## 2015-03-12 NOTE — Progress Notes (Signed)
Subjective:  Barbara Patterson is a [name] at [redacted]w[redacted]d being seen today for ongoing prenatal care.  Patient reports no complaints.  Contractions: Irritability.  Vag. Bleeding: None. Movement: Present. Denies leaking of fluid.   The following portions of the patient's history were reviewed and updated as appropriate: allergies, current medications, past family history, past medical history, past social history, past surgical history and problem list.   Objective:   Filed Vitals:   03/12/15 0820  BP: 98/63  Pulse: 83  Temp: 97.6 F (36.4 C)  Weight: 222 lb 3.2 oz (100.789 kg)    Fetal Status: Fetal Heart Rate (bpm): 143   Movement: Present     General:  Alert, oriented and cooperative. Patient is in no acute distress.  Skin: Skin is warm and dry. No rash noted.   Cardiovascular: Normal heart rate noted  Respiratory: Normal respiratory effort, no problems with respiration noted  Abdomen: Soft, gravid, appropriate for gestational age. Pain/Pressure: Present     Vaginal: Vag. Bleeding: None.       Cervix: Not evaluated        Extremities: Normal range of motion.  Edema: None  Mental Status: Normal mood and affect. Normal behavior. Normal judgment and thought content.   Urinalysis: Urine Protein: Negative Urine Glucose: Negative  Assessment and Plan:  Pregnancy: G3P1011 at [redacted]w[redacted]d  1. Supervision of normal pregnancy in third trimester GBS negative larc discussion today, no decision  Term labor symptoms and general obstetric precautions including but not limited to vaginal bleeding, contractions, leaking of fluid and fetal movement were reviewed in detail with the patient. Please refer to After Visit Summary for other counseling recommendations.  No Follow-up on file.   Kathrynn Running, MD

## 2015-03-12 NOTE — Progress Notes (Signed)
Breastfeeding tip of the week reviewed. 

## 2015-03-20 ENCOUNTER — Inpatient Hospital Stay (HOSPITAL_COMMUNITY)
Admission: AD | Admit: 2015-03-20 | Discharge: 2015-03-21 | Disposition: A | Discharge status: Home / Self Care | Payer: Medicaid Other | Source: Ambulatory Visit | Attending: Obstetrics & Gynecology | Admitting: Obstetrics & Gynecology

## 2015-03-20 ENCOUNTER — Encounter (HOSPITAL_COMMUNITY): Payer: Self-pay | Admitting: *Deleted

## 2015-03-20 DIAGNOSIS — Z3A38 38 weeks gestation of pregnancy: Secondary | ICD-10-CM | POA: Insufficient documentation

## 2015-03-20 DIAGNOSIS — B9689 Other specified bacterial agents as the cause of diseases classified elsewhere: Secondary | ICD-10-CM

## 2015-03-20 DIAGNOSIS — R109 Unspecified abdominal pain: Secondary | ICD-10-CM | POA: Insufficient documentation

## 2015-03-20 DIAGNOSIS — N949 Unspecified condition associated with female genital organs and menstrual cycle: Secondary | ICD-10-CM | POA: Diagnosis not present

## 2015-03-20 DIAGNOSIS — O26893 Other specified pregnancy related conditions, third trimester: Secondary | ICD-10-CM

## 2015-03-20 DIAGNOSIS — O23593 Infection of other part of genital tract in pregnancy, third trimester: Secondary | ICD-10-CM | POA: Diagnosis not present

## 2015-03-20 DIAGNOSIS — N760 Acute vaginitis: Secondary | ICD-10-CM

## 2015-03-20 DIAGNOSIS — O9989 Other specified diseases and conditions complicating pregnancy, childbirth and the puerperium: Secondary | ICD-10-CM | POA: Diagnosis not present

## 2015-03-20 DIAGNOSIS — Z87891 Personal history of nicotine dependence: Secondary | ICD-10-CM | POA: Diagnosis not present

## 2015-03-20 LAB — URINALYSIS, ROUTINE W REFLEX MICROSCOPIC
BILIRUBIN URINE: NEGATIVE
Glucose, UA: NEGATIVE mg/dL
HGB URINE DIPSTICK: NEGATIVE
Ketones, ur: NEGATIVE mg/dL
Leukocytes, UA: NEGATIVE
Nitrite: NEGATIVE
Protein, ur: NEGATIVE mg/dL
Specific Gravity, Urine: 1.02 (ref 1.005–1.030)
Urobilinogen, UA: 1 mg/dL (ref 0.0–1.0)
pH: 6 (ref 5.0–8.0)

## 2015-03-20 LAB — POCT FERN TEST: POCT FERN TEST: NEGATIVE

## 2015-03-20 LAB — WET PREP, GENITAL
TRICH WET PREP: NONE SEEN
YEAST WET PREP: NONE SEEN

## 2015-03-20 MED ORDER — METRONIDAZOLE 500 MG PO TABS: 500.0000 mg | ORAL_TABLET | Freq: Two times a day (BID) | ORAL | Status: DC

## 2015-03-20 NOTE — MAU Note (Addendum)
Patient states she has been having abdominal pain for last two weeks, but tonight around 1900 she went to the bathroom and felt a "pop" at the same time she had to void and is unsure if it was urine or amniotic fluid.  Patient states it hurts when she urinates and the abdominal pain comes and goes with movement. Patient states baby is "very active" and denies vaginal bleeding.

## 2015-03-20 NOTE — Discharge Instructions (Signed)
Call the clinic or go to [facility] if:  You begin to have strong, frequent contractions  Your water breaks.  Sometimes it is a big gush of fluid, sometimes it is just a trickle that keeps getting your panties wet or running down your legs  You have vaginal bleeding.  It is normal to have a small amount of spotting if your cervix was checked.  You don't feel your baby moving like normal.  If you don't, get you something to eat and drink and lay down and focus on feeling your baby move.  You should feel at least 10 movements in 2 hours.  If you don't, you should call the office or go to [facility]. Bacterial Vaginosis Bacterial vaginosis is a vaginal infection that occurs when the normal balance of bacteria in the vagina is disrupted. It results from an overgrowth of certain bacteria. This is the most common vaginal infection in women of childbearing age. Treatment is important to prevent complications, especially in pregnant women, as it can cause a premature delivery. CAUSES  Bacterial vaginosis is caused by an increase in harmful bacteria that are normally present in smaller amounts in the vagina. Several different kinds of bacteria can cause bacterial vaginosis. However, the reason that the condition develops is not fully understood. RISK FACTORS Certain activities or behaviors can put you at an increased risk of developing bacterial vaginosis, including: Having a new sex partner or multiple sex partners. Douching. Using an intrauterine device (IUD) for contraception. Women do not get bacterial vaginosis from toilet seats, bedding, swimming pools, or contact with objects around them. SIGNS AND SYMPTOMS  Some women with bacterial vaginosis have no signs or symptoms. Common symptoms include: Grey vaginal discharge. A fishlike odor with discharge, especially after sexual intercourse. Itching or burning of the vagina and vulva. Burning or pain with urination. DIAGNOSIS  Your health  care provider will take a medical history and examine the vagina for signs of bacterial vaginosis. A sample of vaginal fluid may be taken. Your health care provider will look at this sample under a microscope to check for bacteria and abnormal cells. A vaginal pH test may also be done.  TREATMENT  Bacterial vaginosis may be treated with antibiotic medicines. These may be given in the form of a pill or a vaginal cream. A second round of antibiotics may be prescribed if the condition comes back after treatment.  HOME CARE INSTRUCTIONS  Only take over-the-counter or prescription medicines as directed by your health care provider. If antibiotic medicine was prescribed, take it as directed. Make sure you finish it even if you start to feel better. Do not have sex until treatment is completed. Tell all sexual partners that you have a vaginal infection. They should see their health care provider and be treated if they have problems, such as a mild rash or itching. Practice safe sex by using condoms and only having one sex partner. SEEK MEDICAL CARE IF:  Your symptoms are not improving after 3 days of treatment. You have increased discharge or pain. You have a fever. MAKE SURE YOU:  Understand these instructions. Will watch your condition. Will get help right away if you are not doing well or get worse. FOR MORE INFORMATION  Centers for Disease Control and Prevention, Division of STD Prevention: SolutionApps[employer]za American Sexual Health Association (ASHA): www.ashastd.org  Document Released: 07/24/2005 Document Revised: 05/14/2013 Document Reviewed: 03/05/2013 [facility] Patient Information 2015 [place], [place]. This information is not intended to replace advice given  to you by your health care provider. Make sure you discuss any questions you have with your health care provider.

## 2015-03-20 NOTE — MAU Provider Note (Signed)
History     CSN: [id number]  Arrival date and time: 03/20/15 2206   First Provider Initiated Contact with Patient 03/20/15 2233      Chief Complaint  Patient presents with  . Abdominal Pain   HPI  Patient is 29 y.o. [id number] [redacted]w[redacted]d here with complaints of abdominal pain and pain with urination. States that the abdominal pain has been occuring for x2 weeks. Pain is located midline suprapubic. Tonight around 0900 she was urinating and felt a "pop". Unsure if she had LOF, but states that the pop was painful. She also reports clear-white milky discharge.   +FM. Occasionally feels contractions, but has not had any since arriving to MAU.   Negative GC/chlamydia from 7/27     OB History    Gravida Para Term Preterm AB TAB SAB Ectopic Multiple Living   0 Past Medical History  Diagnosis Date  . Anxiety   . Depression     Past Surgical History  Procedure Laterality Date  . Cesarean section      Family History  Problem Relation Age of Onset  . Diabetes Mother   . Kidney disease Mother   . Kidney disease Father   . Diabetes Sister   . Kidney disease Sister   . Kidney disease Brother   . Asthma Son   . Diabetes Maternal Grandmother   . Heart disease Paternal Grandmother     Social History  Substance Use Topics  . Smoking status: Former Games developer  . Smokeless tobacco: Not on file  . Alcohol Use: No    Allergies: No Known Allergies  Prescriptions prior to admission  Medication Sig Dispense Refill Last Dose  . acetaminophen (TYLENOL) 500 MG tablet Take 1,000 mg by mouth every 6 (six) hours as needed for mild pain or headache.   Taking  . calcium carbonate (TUMS - DOSED IN MG ELEMENTAL CALCIUM) 500 MG chewable tablet Chew 1 tablet by mouth daily.   Taking  . metoCLOPramide (REGLAN) 10 MG tablet Take 1 tablet (10 mg total) by mouth 4 (four) times daily. 60 tablet 1 Taking  . nitrofurantoin, macrocrystal-monohydrate, (MACROBID) 100 MG capsule Take 1  capsule (100 mg total) by mouth 2 (two) times daily. 14 capsule 0 Taking  . Prenatal Vit-Fe Fumarate-FA (PRENATAL VITAMINS) 28-0.8 MG TABS Take 1 tablet by mouth daily. 30 tablet 12 Taking    Review of Systems  Constitutional: Negative for fever, chills and malaise/fatigue.  HENT: Negative for congestion.   Eyes: Negative for blurred vision and double vision.  Respiratory: Negative for cough and shortness of breath.   Cardiovascular: Negative for chest pain, palpitations, claudication and leg swelling.  Gastrointestinal: Positive for abdominal pain. Negative for heartburn, nausea, vomiting, diarrhea and constipation.  Genitourinary: Positive for dysuria and urgency. Negative for hematuria.       Pelvic pressure with urination   Musculoskeletal: Negative for myalgias and back pain.  Skin: Negative for itching and rash.  Neurological: Negative for dizziness, loss of consciousness and headaches.   Physical Exam   Blood pressure 112/60, pulse 98, temperature 98.1 F (36.7 C), temperature source Oral, resp. rate 16, height  (1.676 m), weight 100.699 kg (222 lb), last menstrual period 06/25/2014, SpO2 97 %.  Physical Exam  Constitutional: She is oriented to person, place, and time. She appears well-developed and well-nourished. No distress.  HENT:  Head: Normocephalic and atraumatic.  Eyes: Conjunctivae and EOM are normal.  Neck: Normal range of motion. No thyromegaly present.  Cardiovascular: Normal rate, regular rhythm and normal heart sounds.  Exam reveals no gallop and no friction rub.   No murmur heard. Respiratory: Breath sounds normal. No respiratory distress. She has no wheezes. She has no rales.  GI: Soft. Bowel sounds are normal. She exhibits no distension. There is no tenderness.  Musculoskeletal: Normal range of motion. She exhibits no edema.  Neurological: She is alert and oriented to person, place, and time.  Skin: Skin is warm and dry. No rash noted. No erythema.   Psychiatric: She has a normal mood and affect. Her behavior is normal.  Dilation: Fingertip Effacement (%): 60 Exam by:: Ellin Saba, MD   SSE: Cervix visually closed. No gush of fluid from os with cough. No pooling of fluid in posterior fornix. Minimal thin white discharge in vagina.   FHR: baseline 145, good variability, +15x15 accels, no decels Toco: no contractions  MAU Course  Procedures  MDM UA --negative  Urine cx ordered  Wet prep -- + clue cells  Fern Test--negative  NST --reviewed and reactive  SSE & Cervical Exam performed   Assessment and Plan  Patient is [name] [redacted]w[redacted]d reporting abdominal pain and pain with urination.  -labor precautions given  -discussed that increased pelvic pressure is normal as woman nears term  -Rx given for Flagyl to tx BV  -stable for discharge to home   De Hollingshead 03/20/2015, 10:44 PM   I spoke with and examined patient and agree with resident/PA/SNM's note and plan of care.  Cheral Marker, CNM, [facility] 03/21/2015 3:49 AM

## 2015-03-22 LAB — CULTURE, OB URINE

## 2015-03-23 ENCOUNTER — Encounter: Payer: Self-pay | Admitting: Obstetrics and Gynecology

## 2015-03-23 ENCOUNTER — Ambulatory Visit (INDEPENDENT_AMBULATORY_CARE_PROVIDER_SITE_OTHER): Payer: Medicaid Other | Admitting: Obstetrics and Gynecology

## 2015-03-23 VITALS — BP 116/61 | HR 82 | Temp 98.5°F | Wt 222.0 lb

## 2015-03-23 DIAGNOSIS — O3421 Maternal care for scar from previous cesarean delivery: Secondary | ICD-10-CM

## 2015-03-23 DIAGNOSIS — F1210 Cannabis abuse, uncomplicated: Secondary | ICD-10-CM

## 2015-03-23 DIAGNOSIS — O99323 Drug use complicating pregnancy, third trimester: Secondary | ICD-10-CM | POA: Diagnosis present

## 2015-03-23 DIAGNOSIS — F1290 Cannabis use, unspecified, uncomplicated: Secondary | ICD-10-CM

## 2015-03-23 DIAGNOSIS — O34219 Maternal care for unspecified type scar from previous cesarean delivery: Secondary | ICD-10-CM

## 2015-03-23 LAB — POCT URINALYSIS DIP (DEVICE)
BILIRUBIN URINE: NEGATIVE
GLUCOSE, UA: NEGATIVE mg/dL
Hgb urine dipstick: NEGATIVE
Ketones, ur: NEGATIVE mg/dL
NITRITE: NEGATIVE
Protein, ur: NEGATIVE mg/dL
Specific Gravity, Urine: 1.015 (ref 1.005–1.030)
UROBILINOGEN UA: 0.2 mg/dL (ref 0.0–1.0)
pH: 7 (ref 5.0–8.0)

## 2015-03-23 NOTE — Progress Notes (Signed)
Pt reports having contractions about 13 min apart; go away with walking around Pt went to MAU on 03/20/15 for abd pain  Pt requests cervical exam

## 2015-03-23 NOTE — Addendum Note (Signed)
Addended by: Caren Griffins C on: 03/23/2015 10:51 AM   Modules accepted: Orders

## 2015-03-23 NOTE — Progress Notes (Signed)
Subjective:  Barbara Patterson is a [name] at [redacted]w[redacted]d being seen today for ongoing prenatal care.  Patient reports backache and cramping.   Contractions: Irregular.  Vag. Bleeding: None. Movement: Present. Denies leaking of fluid.  Does not know indication for C/S in [place], max dilation was 2 cm. Still desires TOLAC.  The following portions of the patient's history were reviewed and updated as appropriate: allergies, current medications, past family history, past medical history, past social history, past surgical history and problem list.   Objective:   Filed Vitals:   03/23/15 0954  BP: 116/61  Pulse: 82  Temp: 98.5 F (36.9 C)  Weight: 222 lb (100.699 kg)    Fetal Status: Fetal Heart Rate (bpm): 147   Movement: Present     General:  Alert, oriented and cooperative. Patient is in no acute distress.  Skin: Skin is warm and dry. No rash noted.   Cardiovascular: Normal heart rate noted  Respiratory: Normal respiratory effort, no problems with respiration noted  Abdomen: Soft, gravid, appropriate for gestational age. Pain/Pressure: Present     Pelvic: Vag. Bleeding: None     Cervical exam performed      posterior, long, FT/-3  Extremities: Normal range of motion.  Edema: None  Mental Status: Normal mood and affect. Normal behavior. Normal judgment and thought content.   Urinalysis: Urine Protein: Negative Urine Glucose: Negative  Assessment and Plan:  Pregnancy: G3P1011 at [redacted]w[redacted]d  1. History of cesarean delivery, antepartum For TOLAC  Term labor symptoms and general obstetric precautions including but not limited to vaginal bleeding, contractions, leaking of fluid and fetal movement were reviewed in detail with the patient. Please refer to After Visit Summary for other counseling recommendations.  Return in about 1 week (around 03/30/2015).   Danae Orleans, CNM

## 2015-03-24 LAB — DRUG SCREEN, URINE
Amphetamine Screen, Ur: NEGATIVE
BARBITURATE QUANT UR: NEGATIVE
BENZODIAZEPINES.: NEGATIVE
Cocaine Metabolites: NEGATIVE
Creatinine,U: 85.37 mg/dL
METHADONE: NEGATIVE
Marijuana Metabolite: NEGATIVE
Opiates: NEGATIVE
PROPOXYPHENE: NEGATIVE
Phencyclidine (PCP): NEGATIVE

## 2015-03-30 ENCOUNTER — Ambulatory Visit (INDEPENDENT_AMBULATORY_CARE_PROVIDER_SITE_OTHER): Payer: Medicaid Other | Admitting: Advanced Practice Midwife

## 2015-03-30 VITALS — BP 97/53 | HR 80 | Temp 98.3°F | Wt 223.9 lb

## 2015-03-30 DIAGNOSIS — Z3483 Encounter for supervision of other normal pregnancy, third trimester: Secondary | ICD-10-CM

## 2015-03-30 DIAGNOSIS — O3421 Maternal care for scar from previous cesarean delivery: Secondary | ICD-10-CM | POA: Diagnosis not present

## 2015-03-30 DIAGNOSIS — Z23 Encounter for immunization: Secondary | ICD-10-CM | POA: Diagnosis not present

## 2015-03-30 DIAGNOSIS — O34219 Maternal care for unspecified type scar from previous cesarean delivery: Secondary | ICD-10-CM

## 2015-03-30 DIAGNOSIS — O99323 Drug use complicating pregnancy, third trimester: Secondary | ICD-10-CM | POA: Diagnosis not present

## 2015-03-30 DIAGNOSIS — Z3493 Encounter for supervision of normal pregnancy, unspecified, third trimester: Secondary | ICD-10-CM

## 2015-03-30 DIAGNOSIS — F1210 Cannabis abuse, uncomplicated: Secondary | ICD-10-CM | POA: Diagnosis not present

## 2015-03-30 LAB — POCT URINALYSIS DIP (DEVICE)
BILIRUBIN URINE: NEGATIVE
GLUCOSE, UA: NEGATIVE mg/dL
Hgb urine dipstick: NEGATIVE
KETONES UR: NEGATIVE mg/dL
LEUKOCYTES UA: NEGATIVE
NITRITE: NEGATIVE
Protein, ur: NEGATIVE mg/dL
Specific Gravity, Urine: 1.015 (ref 1.005–1.030)
Urobilinogen, UA: 0.2 mg/dL (ref 0.0–1.0)
pH: 6 (ref 5.0–8.0)

## 2015-03-30 NOTE — Progress Notes (Signed)
Subjective:  Barbara Patterson is a [name] at [redacted]w[redacted]d being seen today for ongoing prenatal care.  Patient reports irregular contractions.  Contractions: Irregular.  Vag. Bleeding: None. Movement: Present. Denies leaking of fluid.   The following portions of the patient's history were reviewed and updated as appropriate: allergies, current medications, past family history, past medical history, past social history, past surgical history and problem list.   Objective:   Filed Vitals:   03/30/15 0951  BP: 97/53  Pulse: 80  Temp: 98.3 F (36.8 C)  Weight: 223 lb 14.4 oz (101.56 kg)    Fetal Status: Fetal Heart Rate (bpm): 146 Fundal Height: 39 cm Movement: Present     General:  Alert, oriented and cooperative. Patient is in no acute distress.  Skin: Skin is warm and dry. No rash noted.   Cardiovascular: Normal heart rate noted  Respiratory: Normal respiratory effort, no problems with respiration noted  Abdomen: Soft, gravid, appropriate for gestational age. Pain/Pressure: Present     Pelvic: Vag. Bleeding: None     Cervical exam performed Dilation: Fingertip Effacement (%): 50 Station: -3  Extremities: Normal range of motion.  Edema: None  Mental Status: Normal mood and affect. Normal behavior. Normal judgment and thought content.   Urinalysis:      Assessment and Plan:  Pregnancy: G3P1011 at [redacted]w[redacted]d  1. Prenatal care, subsequent pregnancy, third trimester  - Flu Vaccine QUAD 36+ mos IM; Standing - Flu Vaccine QUAD 36+ mos IM  2. Supervision of normal pregnancy in third trimester  3.  Hx C/S with previous pregnancy  Term labor symptoms and general obstetric precautions including but not limited to vaginal bleeding, contractions, leaking of fluid and fetal movement were reviewed in detail with the patient. Please refer to After Visit Summary for other counseling recommendations.  Return in about 1 week (around 04/06/2015).   Hurshel Party, CNM

## 2015-04-04 ENCOUNTER — Encounter (HOSPITAL_COMMUNITY): Payer: Self-pay

## 2015-04-04 ENCOUNTER — Inpatient Hospital Stay (HOSPITAL_COMMUNITY)
Admission: AD | Admit: 2015-04-04 | Discharge: 2015-04-04 | Disposition: A | Discharge status: Home / Self Care | Payer: Medicaid Other | Source: Ambulatory Visit | Attending: Obstetrics & Gynecology | Admitting: Obstetrics & Gynecology

## 2015-04-04 DIAGNOSIS — Z3493 Encounter for supervision of normal pregnancy, unspecified, third trimester: Secondary | ICD-10-CM | POA: Diagnosis present

## 2015-04-04 NOTE — MAU Note (Signed)
Barbara Patterson presents at 40w 3d gestation.  C/O of contractions beginning at 1000 every 5 minutes.  Denies vaginal bleeding or discharge.

## 2015-04-04 NOTE — Discharge Instructions (Signed)

## 2015-04-05 ENCOUNTER — Inpatient Hospital Stay (HOSPITAL_COMMUNITY)
Admission: AD | Admit: 2015-04-05 | Discharge: 2015-04-05 | Disposition: A | Discharge status: Home / Self Care | Payer: Medicaid Other | Source: Ambulatory Visit | Attending: Family Medicine | Admitting: Family Medicine

## 2015-04-05 ENCOUNTER — Encounter (HOSPITAL_COMMUNITY): Payer: Self-pay | Admitting: *Deleted

## 2015-04-05 DIAGNOSIS — O34219 Maternal care for unspecified type scar from previous cesarean delivery: Secondary | ICD-10-CM

## 2015-04-05 DIAGNOSIS — Z3A40 40 weeks gestation of pregnancy: Secondary | ICD-10-CM

## 2015-04-05 DIAGNOSIS — Z3493 Encounter for supervision of normal pregnancy, unspecified, third trimester: Secondary | ICD-10-CM

## 2015-04-05 NOTE — Discharge Instructions (Signed)

## 2015-04-05 NOTE — MAU Note (Signed)
Pt states she's been having U/C's since last night at 2200 when she came to hospital to be evaluated.  Sent home.  Pt states contractions continue to be every 5 min apart until 1 hour ago when she started experiencing them every 2-3 minutes apart.  Denies vaginal bleeding, ROM, or abnormal discharge.  Good fetal movement.

## 2015-04-06 ENCOUNTER — Encounter (HOSPITAL_COMMUNITY): Payer: Self-pay | Admitting: *Deleted

## 2015-04-06 ENCOUNTER — Ambulatory Visit (HOSPITAL_COMMUNITY)
Admission: RE | Admit: 2015-04-06 | Discharge: 2015-04-06 | Disposition: A | Discharge status: Home / Self Care | Payer: Medicaid Other | Source: Ambulatory Visit | Attending: Advanced Practice Midwife | Admitting: Advanced Practice Midwife

## 2015-04-06 ENCOUNTER — Inpatient Hospital Stay (HOSPITAL_COMMUNITY)
Admission: AD | Admit: 2015-04-06 | Discharge: 2015-04-09 | DRG: 766 | Disposition: A | Discharge status: Home / Self Care | Payer: Medicaid Other | Source: Ambulatory Visit | Attending: Obstetrics & Gynecology | Admitting: Obstetrics & Gynecology

## 2015-04-06 ENCOUNTER — Encounter: Payer: Self-pay | Admitting: *Deleted

## 2015-04-06 ENCOUNTER — Ambulatory Visit (INDEPENDENT_AMBULATORY_CARE_PROVIDER_SITE_OTHER): Payer: Medicaid Other | Admitting: Advanced Practice Midwife

## 2015-04-06 ENCOUNTER — Telehealth (HOSPITAL_COMMUNITY): Payer: Self-pay | Admitting: *Deleted

## 2015-04-06 VITALS — BP 102/52 | HR 79 | Temp 98.3°F | Wt 226.9 lb

## 2015-04-06 DIAGNOSIS — O480 Post-term pregnancy: Secondary | ICD-10-CM

## 2015-04-06 DIAGNOSIS — O99334 Smoking (tobacco) complicating childbirth: Secondary | ICD-10-CM | POA: Diagnosis present

## 2015-04-06 DIAGNOSIS — Z833 Family history of diabetes mellitus: Secondary | ICD-10-CM | POA: Diagnosis not present

## 2015-04-06 DIAGNOSIS — F17210 Nicotine dependence, cigarettes, uncomplicated: Secondary | ICD-10-CM | POA: Diagnosis present

## 2015-04-06 DIAGNOSIS — Z3A40 40 weeks gestation of pregnancy: Secondary | ICD-10-CM | POA: Diagnosis present

## 2015-04-06 DIAGNOSIS — Z98891 History of uterine scar from previous surgery: Secondary | ICD-10-CM

## 2015-04-06 DIAGNOSIS — Z8249 Family history of ischemic heart disease and other diseases of the circulatory system: Secondary | ICD-10-CM | POA: Diagnosis not present

## 2015-04-06 DIAGNOSIS — F1290 Cannabis use, unspecified, uncomplicated: Secondary | ICD-10-CM | POA: Diagnosis not present

## 2015-04-06 DIAGNOSIS — O3421 Maternal care for scar from previous cesarean delivery: Principal | ICD-10-CM | POA: Diagnosis present

## 2015-04-06 DIAGNOSIS — Z3493 Encounter for supervision of normal pregnancy, unspecified, third trimester: Secondary | ICD-10-CM

## 2015-04-06 DIAGNOSIS — O99324 Drug use complicating childbirth: Secondary | ICD-10-CM | POA: Diagnosis not present

## 2015-04-06 DIAGNOSIS — O34219 Maternal care for unspecified type scar from previous cesarean delivery: Secondary | ICD-10-CM

## 2015-04-06 LAB — CBC
HCT: 34.3 % — ABNORMAL LOW (ref 36.0–46.0)
HEMOGLOBIN: 11.3 g/dL — AB (ref 12.0–15.0)
MCH: 28 pg (ref 26.0–34.0)
MCHC: 32.9 g/dL (ref 30.0–36.0)
MCV: 84.9 fL (ref 78.0–100.0)
PLATELETS: 323 10*3/uL (ref 150–400)
RBC: 4.04 MIL/uL (ref 3.87–5.11)
RDW: 14.5 % (ref 11.5–15.5)
WBC: 9.8 10*3/uL (ref 4.0–10.5)

## 2015-04-06 LAB — TYPE AND SCREEN
ABO/RH(D): A POS
ANTIBODY SCREEN: NEGATIVE

## 2015-04-06 LAB — POCT URINALYSIS DIP (DEVICE)
Bilirubin Urine: NEGATIVE
GLUCOSE, UA: NEGATIVE mg/dL
Hgb urine dipstick: NEGATIVE
Ketones, ur: NEGATIVE mg/dL
NITRITE: NEGATIVE
PROTEIN: NEGATIVE mg/dL
Specific Gravity, Urine: 1.01 (ref 1.005–1.030)
Urobilinogen, UA: 0.2 mg/dL (ref 0.0–1.0)
pH: 6.5 (ref 5.0–8.0)

## 2015-04-06 LAB — ABO/RH: ABO/RH(D): A POS

## 2015-04-06 MED ORDER — PRENATAL MULTIVITAMIN CH
1.0000 | ORAL_TABLET | Freq: Every day | ORAL | Status: DC
  Administered 2015-04-06: 1 via ORAL
  Filled 2015-04-06: qty 1

## 2015-04-06 MED ORDER — OXYTOCIN 40 UNITS IN LACTATED RINGERS INFUSION - SIMPLE MED: 1.0000 m[IU]/min | INTRAVENOUS | Status: DC

## 2015-04-06 MED ORDER — OXYTOCIN 40 UNITS IN LACTATED RINGERS INFUSION - SIMPLE MED: 62.5000 mL/h | INTRAVENOUS | Status: DC

## 2015-04-06 MED ORDER — TERBUTALINE SULFATE 1 MG/ML IJ SOLN: 0.2500 mg | Freq: Once | INTRAMUSCULAR | Status: DC | PRN

## 2015-04-06 MED ORDER — ACETAMINOPHEN 325 MG PO TABS: 650.0000 mg | ORAL_TABLET | ORAL | Status: DC | PRN

## 2015-04-06 MED ORDER — CITRIC ACID-SODIUM CITRATE 334-500 MG/5ML PO SOLN
30.0000 mL | ORAL | Status: DC | PRN
  Administered 2015-04-07: 30 mL via ORAL
  Filled 2015-04-06: qty 15

## 2015-04-06 MED ORDER — ZOLPIDEM TARTRATE 5 MG PO TABS
5.0000 mg | ORAL_TABLET | Freq: Every evening | ORAL | Status: DC | PRN
Start: 2015-04-06 — End: 2015-04-07

## 2015-04-06 MED ORDER — LACTATED RINGERS IV SOLN
INTRAVENOUS | Status: DC
  Administered 2015-04-06 – 2015-04-07 (×3): via INTRAVENOUS

## 2015-04-06 MED ORDER — OXYCODONE-ACETAMINOPHEN 5-325 MG PO TABS: 1.0000 | ORAL_TABLET | ORAL | Status: DC | PRN

## 2015-04-06 MED ORDER — OXYCODONE-ACETAMINOPHEN 5-325 MG PO TABS: 2.0000 | ORAL_TABLET | ORAL | Status: DC | PRN

## 2015-04-06 MED ORDER — OXYTOCIN 40 UNITS IN LACTATED RINGERS INFUSION - SIMPLE MED
8.0000 m[IU]/min | INTRAVENOUS | Status: DC
Start: 2015-04-06 — End: 2015-04-07
  Filled 2015-04-06: qty 1000

## 2015-04-06 MED ORDER — BUTORPHANOL TARTRATE 1 MG/ML IJ SOLN
1.0000 mg | INTRAMUSCULAR | Status: DC | PRN
  Administered 2015-04-06 – 2015-04-07 (×2): 1 mg via INTRAVENOUS
  Filled 2015-04-06 (×3): qty 1

## 2015-04-06 MED ORDER — PRENATAL VITAMINS 28-0.8 MG PO TABS: 1.0000 | ORAL_TABLET | Freq: Every day | ORAL | Status: DC

## 2015-04-06 MED ORDER — OXYTOCIN BOLUS FROM INFUSION: 500.0000 mL | INTRAVENOUS | Status: DC

## 2015-04-06 MED ORDER — LACTATED RINGERS IV SOLN: 500.0000 mL | INTRAVENOUS | Status: DC | PRN

## 2015-04-06 MED ORDER — ONDANSETRON HCL 4 MG/2ML IJ SOLN
4.0000 mg | Freq: Four times a day (QID) | INTRAMUSCULAR | Status: DC | PRN
  Administered 2015-04-07: 4 mg via INTRAVENOUS

## 2015-04-06 MED ORDER — MISOPROSTOL 25 MCG QUARTER TABLET
25.0000 ug | ORAL_TABLET | ORAL | Status: DC | PRN
  Filled 2015-04-06: qty 0.25

## 2015-04-06 NOTE — Telephone Encounter (Signed)
Preadmission screen  

## 2015-04-06 NOTE — Progress Notes (Signed)
Placed FB with speculum. Patient will eat dinner then start low dose pitocin for cervical ripening. Unable to give cytotec given TOLAC/uterine scar.   Federico Flake, MD

## 2015-04-06 NOTE — Progress Notes (Signed)
Up to BR.

## 2015-04-06 NOTE — Progress Notes (Signed)
Labor Progress Note  Barbara Patterson is a [name] at [redacted]w[redacted]d  admitted for induction of labor due to non-reassuring FHR.  S: Patient doing well. Has not let anyone check her cervix yet. She states she is anticipating that it will be painful. Wants to know if she can get medications first.   O:  BP 115/65 mmHg  Pulse 79  Temp(Src) 98.7 F (37.1 C) (Oral)  Resp 18  Ht  (1.702 m)  Wt 226 lb (102.513 kg)  BMI 35.39 kg/m2  LMP 06/25/2014  FHT:  FHR: 140 bpm, variability: moderate,  accelerations:  Present,  decelerations:  Absent UC:   irregular SVE:   Dilation: 1.5 Effacement (%): 50 Cervical Position: Posterior Station: -3 Presentation: Vertex Exam by:: Dr. Alvester Morin  Labs: Lab Results  Component Value Date   WBC 9.8 04/06/2015   HGB 11.3* 04/06/2015   HCT 34.3* 04/06/2015   MCV 84.9 04/06/2015   PLT 323 04/06/2015    Assessment / Plan: [name] [redacted]w[redacted]d not in labor. Induction of labor due to non-reassuring fetal testing,  progressing well on pitocin  Labor: IOL with FB; Avoid cytotec in TOLAC patient.  Fetal Wellbeing:  Category I Pain Control:  IV pain medications prn Anticipated MOD:  NSVD  Expectant management   Caryl Ada, DO 04/06/2015, 9:30 PM PGY-2, [facility]

## 2015-04-06 NOTE — MAU Note (Signed)
Pt came from clinic. Had some variables and sent for BPP which was 8/8. Now here for admission and will have induction. BS unable to take pt currently.

## 2015-04-06 NOTE — H&P (Signed)
LABOR ADMISSION HISTORY AND PHYSICAL  Barbara Patterson is a 29 y.o. female G3P1011 with IUP at [redacted]w[redacted]d by LMP and 12 wk [place] presenting for IOL for non-reassuring NST with variable decelerations. She reports +FM, + contractions, No LOF, no VB, no blurry vision, headaches or peripheral edema, and RUQ pain.  She plans on breast feeding. She request Barbara Patterson for birth control.  Dating: By LMP and 12 wk [place] --->  Estimated Date of Delivery: 04/01/15  Sono:  No records available   Prenatal History/Complications:  Clinic [facility], late transfer from [place] Prenatal Labs  Dating LMP/12 week [place] Blood type:   A pos  Genetic Screen 1 Screen:    AFP:     Quad:     NIPS: Antibody: Neg  Anatomic [place]  Normal female (Barbara Patterson per early [place] resolved on anatomy scan) Rubella:  Immune  GTT Early:               Third trimester: 105 RPR:   NR  Flu vaccine  03/30/15 HBsAg:   Neg  TDaP vaccine  01/27/15                                      Rhogam: NA HIV:   NR  GBS   neg                                           (For PCN allergy, check sensitivities) GBS:   Baby Food  Breast Pap: Normal per pt   Contraception  Undecided, LARC discussed   Circumcision  Undecided   Pediatrician  Dr. Kathlene November   Support Person  Barbara Patterson (grandmother)     Past Medical History: Past Medical History  Diagnosis Date  . Anxiety   . Depression     Past Surgical History: Past Surgical History  Procedure Laterality Date  . Cesarean section      Obstetrical History: OB History    Gravida Para Term Preterm AB TAB SAB Ectopic Multiple Living   0 Social History: Social History   Social History  . Marital Status: Unknown    Spouse Name: N/A  . Number of Children: N/A  . Years of Education: N/A   Social History Main Topics  . Smoking status: Current Some Day Smoker -- 0.25 packs/day for 15 years  . Smokeless tobacco: Current User  . Alcohol Use: No  . Drug Use: Yes    Special: Marijuana     Comment: Last  smokked marijuana about a wk ago  . Sexual Activity: Yes    Birth Control/ Protection: None   Other Topics Concern  . None   Social History Narrative    Family History: Family History  Problem Relation Age of Onset  . Diabetes Mother   . Kidney disease Mother   . Kidney disease Father   . Diabetes Sister   . Kidney disease Sister   . Kidney disease Brother   . Asthma Son   . Diabetes Maternal Grandmother   . Heart disease Paternal Grandmother     Allergies: No Known Allergies  Prescriptions prior to admission  Medication Sig Dispense Refill Last Dose  . Prenatal Vit-Fe Fumarate-FA (PRENATAL VITAMINS) 28-0.8 MG TABS Take 1 tablet  by mouth daily. 30 tablet 12 04/05/2015 at Unknown time     Review of Systems   All systems reviewed and negative except as stated in HPI  BP 127/69 mmHg  Pulse 94  Temp(Src) 98.1 F (36.7 C)  Resp 18  Ht 5\' 7"  (1.702 m)  Wt 226 lb (102.513 kg)  BMI 35.39 kg/m2  LMP 06/25/2014 General appearance: alert, cooperative and no distress Lungs: clear to auscultation bilaterally Heart: regular rate and rhythm Abdomen: soft, non-tender; bowel sounds normal Pelvic: 2 cm/60%/ -3 Extremities: no sign of DVT, edema Presentation: cephalic Fetal monitoring: Baseline: 135 bpm, Variability: Good {> 6 bpm) and Accelerations: Reactive Uterine activity: Frequency: 1 times per hour; mild contraction quality    Prenatal Transfer Tool  Maternal Diabetes: No Genetic Screening: Normal Maternal Ultrasounds/Referrals: Normal Fetal Ultrasounds or other Referrals:  None Maternal Substance Abuse:  Yes:  Type: Smoker, Marijuana Significant Maternal Medications:  None Significant Maternal Lab Results: None  Results for orders placed or performed in visit on 04/06/15 (from the past 24 hour(s))  POCT urinalysis dip (device)   Collection Time: 04/06/15  9:40 AM  Result Value Ref Range   Glucose, UA NEGATIVE NEGATIVE mg/dL   Bilirubin Urine NEGATIVE NEGATIVE    Ketones, ur NEGATIVE NEGATIVE mg/dL   Specific Gravity, Urine 1.010 1.005 - 1.030   Hgb urine dipstick NEGATIVE NEGATIVE   pH 6.5 5.0 - 8.0   Protein, ur NEGATIVE NEGATIVE mg/dL   Urobilinogen, UA 0.2 0.0 - 1.0 mg/dL   Nitrite NEGATIVE NEGATIVE   Leukocytes, UA TRACE (A) NEGATIVE    Patient Active Problem List   Diagnosis Date Noted  . Marijuana use 02/02/2015  . History of cesarean delivery, antepartum 01/27/2015  . Supervision of normal pregnancy in third trimester 01/27/2015  . Anxiety 01/27/2015    Assessment: Barbara Patterson is a [name] at [redacted]w[redacted]d here for IOL for non-reassuring FHR with variable decelerations on NST. Desires TOLAC.  #Labor:induction of labor; Foley bulb, pitocin when in active labor  #Pain: IV pain meds PRN; epidural upon request #FWB: Category 1 #ID:  GBS negative #MOF: breast #MOC:Barbara Patterson (outpatient since Medicaid) #Circ:  yes  Tarri Abernethy, MD PGY-1 [facility]

## 2015-04-06 NOTE — Progress Notes (Signed)
Dr Lancaster in to see pt 

## 2015-04-06 NOTE — Progress Notes (Signed)
Breastfeeding tip of the week reviewed. 

## 2015-04-06 NOTE — Progress Notes (Signed)
Subjective:  Barbara Patterson is a [name] at [redacted]w[redacted]d being seen today for ongoing prenatal care.  Patient reports contractions since 2-3 days ago. Contractions worse yesterday than today.  Contractions: Irregular.  Vag. Bleeding: None. Movement: Present. Denies leaking of fluid.   The following portions of the patient's history were reviewed and updated as appropriate: allergies, current medications, past family history, past medical history, past social history, past surgical history and problem list.   Objective:   Filed Vitals:   04/06/15 0950  BP: 102/52  Pulse: 79  Temp: 98.3 F (36.8 C)  Weight: 226 lb 14.4 oz (102.921 kg)    Fetal Status: Fetal Heart Rate (bpm): NST   Movement: Present  Presentation: Vertex  General:  Alert, oriented and cooperative. Patient is in no acute distress.  Skin: Skin is warm and dry. No rash noted.   Cardiovascular: Normal heart rate noted  Respiratory: Normal respiratory effort, no problems with respiration noted  Abdomen: Soft, gravid, appropriate for gestational age. Pain/Pressure: Present     Pelvic: Vag. Bleeding: None     Cervical exam deferred       Pt 2 cm yesterday in MAU.  Extremities: Normal range of motion.  Edema: None  Mental Status: Normal mood and affect. Normal behavior. Normal judgment and thought content.   Urinalysis: Urine Protein: Negative Urine Glucose: Negative  Assessment and Plan:  Pregnancy: G3P1011 at [redacted]w[redacted]d  1. Post term pregnancy, antepartum condition or complication  - Amniotic fluid index with NST - [place] MFM FETAL BPP WO NON STRESS; Future  Term labor symptoms and general obstetric precautions including but not limited to vaginal bleeding, contractions, leaking of fluid and fetal movement were reviewed in detail with the patient. Please refer to After Visit Summary for other counseling recommendations.   NST with accelerations but variable decels x 2.  Consult Dr Macon Large to review FHR tracing.  Pt sent to  [facility] after clinic visit.  No Follow-up on file.   Hurshel Party, CNM

## 2015-04-07 ENCOUNTER — Inpatient Hospital Stay (HOSPITAL_COMMUNITY): Payer: Medicaid Other | Admitting: Anesthesiology

## 2015-04-07 ENCOUNTER — Encounter (HOSPITAL_COMMUNITY): Payer: Self-pay | Admitting: *Deleted

## 2015-04-07 ENCOUNTER — Encounter (HOSPITAL_COMMUNITY)
Admission: AD | Disposition: A | Discharge status: Home / Self Care | Payer: Self-pay | Source: Ambulatory Visit | Attending: Obstetrics & Gynecology

## 2015-04-07 DIAGNOSIS — O368390 Maternal care for abnormalities of the fetal heart rate or rhythm, unspecified trimester, not applicable or unspecified: Secondary | ICD-10-CM | POA: Insufficient documentation

## 2015-04-07 DIAGNOSIS — F1290 Cannabis use, unspecified, uncomplicated: Secondary | ICD-10-CM

## 2015-04-07 DIAGNOSIS — O99344 Other mental disorders complicating childbirth: Secondary | ICD-10-CM

## 2015-04-07 DIAGNOSIS — O99324 Drug use complicating childbirth: Secondary | ICD-10-CM

## 2015-04-07 DIAGNOSIS — F418 Other specified anxiety disorders: Secondary | ICD-10-CM

## 2015-04-07 DIAGNOSIS — Z3A40 40 weeks gestation of pregnancy: Secondary | ICD-10-CM

## 2015-04-07 DIAGNOSIS — O3421 Maternal care for scar from previous cesarean delivery: Secondary | ICD-10-CM

## 2015-04-07 LAB — RPR: RPR Ser Ql: REACTIVE — AB

## 2015-04-07 LAB — RPR, QUANT+TP ABS (REFLEX)
Rapid Plasma Reagin, Quant: 1:1 {titer} — ABNORMAL HIGH
TREPONEMA PALLIDUM AB: NEGATIVE

## 2015-04-07 SURGERY — Surgical Case
Anesthesia: Regional

## 2015-04-07 SURGERY — Surgical Case
Anesthesia: Epidural

## 2015-04-07 MED ORDER — FENTANYL CITRATE (PF) 100 MCG/2ML IJ SOLN: 25.0000 ug | INTRAMUSCULAR | Status: DC | PRN

## 2015-04-07 MED ORDER — EPHEDRINE 5 MG/ML INJ: 10.0000 mg | INTRAVENOUS | Status: DC | PRN

## 2015-04-07 MED ORDER — PRENATAL MULTIVITAMIN CH
1.0000 | ORAL_TABLET | Freq: Every day | ORAL | Status: DC
  Administered 2015-04-08 – 2015-04-09 (×2): 1 via ORAL
  Filled 2015-04-07 (×2): qty 1

## 2015-04-07 MED ORDER — ONDANSETRON HCL 4 MG/2ML IJ SOLN: 4.0000 mg | Freq: Three times a day (TID) | INTRAMUSCULAR | Status: DC | PRN

## 2015-04-07 MED ORDER — IBUPROFEN 600 MG PO TABS
600.0000 mg | ORAL_TABLET | Freq: Four times a day (QID) | ORAL | Status: DC
Start: 2015-04-07 — End: 2015-04-09
  Administered 2015-04-08 – 2015-04-09 (×7): 600 mg via ORAL
  Filled 2015-04-07 (×7): qty 1

## 2015-04-07 MED ORDER — FENTANYL CITRATE (PF) 100 MCG/2ML IJ SOLN
INTRAMUSCULAR | Status: AC
  Administered 2015-04-07: 50 ug
  Filled 2015-04-07: qty 2

## 2015-04-07 MED ORDER — NALBUPHINE HCL 10 MG/ML IJ SOLN
5.0000 mg | INTRAMUSCULAR | Status: DC | PRN
  Filled 2015-04-07: qty 0.5

## 2015-04-07 MED ORDER — NALBUPHINE HCL 10 MG/ML IJ SOLN
5.0000 mg | Freq: Once | INTRAMUSCULAR | Status: DC | PRN
  Filled 2015-04-07: qty 0.5

## 2015-04-07 MED ORDER — KETOROLAC TROMETHAMINE 30 MG/ML IJ SOLN: 30.0000 mg | Freq: Four times a day (QID) | INTRAMUSCULAR | Status: DC | PRN

## 2015-04-07 MED ORDER — LIDOCAINE-EPINEPHRINE (PF) 2 %-1:200000 IJ SOLN
INTRAMUSCULAR | Status: AC
  Filled 2015-04-07: qty 20

## 2015-04-07 MED ORDER — SCOPOLAMINE 1 MG/3DAYS TD PT72
1.0000 | MEDICATED_PATCH | Freq: Once | TRANSDERMAL | Status: DC
  Filled 2015-04-07: qty 1

## 2015-04-07 MED ORDER — OXYTOCIN 40 UNITS IN LACTATED RINGERS INFUSION - SIMPLE MED
1.0000 m[IU]/min | INTRAVENOUS | Status: DC
  Administered 2015-04-07: 1 m[IU]/min via INTRAVENOUS

## 2015-04-07 MED ORDER — MORPHINE SULFATE (PF) 0.5 MG/ML IJ SOLN
INTRAMUSCULAR | Status: DC | PRN
  Administered 2015-04-07: 4 mg via EPIDURAL

## 2015-04-07 MED ORDER — OXYCODONE-ACETAMINOPHEN 5-325 MG PO TABS
1.0000 | ORAL_TABLET | ORAL | Status: DC | PRN
  Administered 2015-04-09 (×3): 1 via ORAL
  Filled 2015-04-07 (×4): qty 1

## 2015-04-07 MED ORDER — SCOPOLAMINE 1 MG/3DAYS TD PT72
MEDICATED_PATCH | TRANSDERMAL | Status: DC | PRN
  Administered 2015-04-07: 1 via TRANSDERMAL

## 2015-04-07 MED ORDER — OXYTOCIN 40 UNITS IN LACTATED RINGERS INFUSION - SIMPLE MED: 62.5000 mL/h | INTRAVENOUS | Status: AC

## 2015-04-07 MED ORDER — IBUPROFEN 600 MG PO TABS
600.0000 mg | ORAL_TABLET | Freq: Four times a day (QID) | ORAL | Status: DC | PRN
  Filled 2015-04-07: qty 1

## 2015-04-07 MED ORDER — DEXAMETHASONE SODIUM PHOSPHATE 4 MG/ML IJ SOLN
INTRAMUSCULAR | Status: AC
  Filled 2015-04-07: qty 1

## 2015-04-07 MED ORDER — SIMETHICONE 80 MG PO CHEW
80.0000 mg | CHEWABLE_TABLET | ORAL | Status: DC
  Administered 2015-04-08 (×2): 80 mg via ORAL
  Filled 2015-04-07 (×2): qty 1

## 2015-04-07 MED ORDER — OXYCODONE-ACETAMINOPHEN 5-325 MG PO TABS: 2.0000 | ORAL_TABLET | ORAL | Status: DC | PRN

## 2015-04-07 MED ORDER — ZOLPIDEM TARTRATE 5 MG PO TABS
5.0000 mg | ORAL_TABLET | Freq: Every evening | ORAL | Status: DC | PRN
Start: 2015-04-07 — End: 2015-04-09

## 2015-04-07 MED ORDER — ONDANSETRON HCL 4 MG/2ML IJ SOLN
INTRAMUSCULAR | Status: AC
  Filled 2015-04-07: qty 2

## 2015-04-07 MED ORDER — LANOLIN HYDROUS EX OINT: 1.0000 "application " | TOPICAL_OINTMENT | CUTANEOUS | Status: DC | PRN

## 2015-04-07 MED ORDER — OXYTOCIN 40 UNITS IN LACTATED RINGERS INFUSION - SIMPLE MED: 1.0000 m[IU]/min | INTRAVENOUS | Status: DC

## 2015-04-07 MED ORDER — SIMETHICONE 80 MG PO CHEW
80.0000 mg | CHEWABLE_TABLET | Freq: Three times a day (TID) | ORAL | Status: DC
Start: 2015-04-07 — End: 2015-04-09
  Administered 2015-04-08 – 2015-04-09 (×5): 80 mg via ORAL
  Filled 2015-04-07 (×5): qty 1

## 2015-04-07 MED ORDER — DIPHENHYDRAMINE HCL 25 MG PO CAPS: 25.0000 mg | ORAL_CAPSULE | ORAL | Status: DC | PRN

## 2015-04-07 MED ORDER — DIBUCAINE 1 % RE OINT: 1.0000 "application " | TOPICAL_OINTMENT | RECTAL | Status: DC | PRN

## 2015-04-07 MED ORDER — DIPHENHYDRAMINE HCL 50 MG/ML IJ SOLN: 12.5000 mg | INTRAMUSCULAR | Status: DC | PRN

## 2015-04-07 MED ORDER — NALOXONE HCL 0.4 MG/ML IJ SOLN: 0.4000 mg | INTRAMUSCULAR | Status: DC | PRN

## 2015-04-07 MED ORDER — SIMETHICONE 80 MG PO CHEW: 80.0000 mg | CHEWABLE_TABLET | ORAL | Status: DC | PRN

## 2015-04-07 MED ORDER — CEFAZOLIN SODIUM-DEXTROSE 2-3 GM-% IV SOLR
2.0000 g | INTRAVENOUS | Status: DC
  Filled 2015-04-07: qty 50

## 2015-04-07 MED ORDER — PHENYLEPHRINE 40 MCG/ML (10ML) SYRINGE FOR IV PUSH (FOR BLOOD PRESSURE SUPPORT)
80.0000 ug | PREFILLED_SYRINGE | INTRAVENOUS | Status: DC | PRN
  Administered 2015-04-07 (×2): 80 ug via INTRAVENOUS
  Filled 2015-04-07: qty 20

## 2015-04-07 MED ORDER — KETOROLAC TROMETHAMINE 30 MG/ML IJ SOLN
INTRAMUSCULAR | Status: AC
  Administered 2015-04-07: 30 mg via INTRAMUSCULAR
  Filled 2015-04-07: qty 1

## 2015-04-07 MED ORDER — MENTHOL 3 MG MT LOZG: 1.0000 | LOZENGE | OROMUCOSAL | Status: DC | PRN

## 2015-04-07 MED ORDER — MEPERIDINE HCL 25 MG/ML IJ SOLN: 6.2500 mg | INTRAMUSCULAR | Status: DC | PRN

## 2015-04-07 MED ORDER — SODIUM BICARBONATE 8.4 % IV SOLN
INTRAVENOUS | Status: DC | PRN
  Administered 2015-04-07: 3 mL via EPIDURAL
  Administered 2015-04-07: 5 mL via EPIDURAL
  Administered 2015-04-07: 6 mL via EPIDURAL
  Administered 2015-04-07: 3 mL via EPIDURAL

## 2015-04-07 MED ORDER — SCOPOLAMINE 1 MG/3DAYS TD PT72
MEDICATED_PATCH | TRANSDERMAL | Status: AC
  Filled 2015-04-07: qty 1

## 2015-04-07 MED ORDER — DIPHENHYDRAMINE HCL 25 MG PO CAPS: 25.0000 mg | ORAL_CAPSULE | Freq: Four times a day (QID) | ORAL | Status: DC | PRN

## 2015-04-07 MED ORDER — SODIUM CHLORIDE 0.9 % IJ SOLN: 3.0000 mL | INTRAMUSCULAR | Status: DC | PRN

## 2015-04-07 MED ORDER — DEXAMETHASONE SODIUM PHOSPHATE 4 MG/ML IJ SOLN
INTRAMUSCULAR | Status: DC | PRN
  Administered 2015-04-07: 4 mg via INTRAVENOUS

## 2015-04-07 MED ORDER — ACETAMINOPHEN 325 MG PO TABS
650.0000 mg | ORAL_TABLET | ORAL | Status: DC | PRN
Start: 2015-04-07 — End: 2015-04-09
  Administered 2015-04-08: 650 mg via ORAL
  Filled 2015-04-07: qty 2

## 2015-04-07 MED ORDER — LACTATED RINGERS IV SOLN
INTRAVENOUS | Status: DC | PRN
  Administered 2015-04-07: 16:00:00 via INTRAVENOUS

## 2015-04-07 MED ORDER — LACTATED RINGERS IV SOLN
INTRAVENOUS | Status: DC | PRN
  Administered 2015-04-07 (×3): via INTRAVENOUS

## 2015-04-07 MED ORDER — LIDOCAINE HCL (PF) 1 % IJ SOLN
INTRAMUSCULAR | Status: DC | PRN
  Administered 2015-04-07 (×2): 4 mL

## 2015-04-07 MED ORDER — KETOROLAC TROMETHAMINE 30 MG/ML IJ SOLN
30.0000 mg | Freq: Four times a day (QID) | INTRAMUSCULAR | Status: DC | PRN
  Administered 2015-04-07: 30 mg via INTRAMUSCULAR

## 2015-04-07 MED ORDER — TERBUTALINE SULFATE 1 MG/ML IJ SOLN: 0.2500 mg | Freq: Once | INTRAMUSCULAR | Status: DC | PRN

## 2015-04-07 MED ORDER — WITCH HAZEL-GLYCERIN EX PADS: 1.0000 "application " | MEDICATED_PAD | CUTANEOUS | Status: DC | PRN

## 2015-04-07 MED ORDER — FENTANYL CITRATE (PF) 100 MCG/2ML IJ SOLN: 100.0000 ug | INTRAMUSCULAR | Status: DC | PRN

## 2015-04-07 MED ORDER — NALOXONE HCL 1 MG/ML IJ SOLN
1.0000 ug/kg/h | INTRAMUSCULAR | Status: DC | PRN
  Filled 2015-04-07: qty 2

## 2015-04-07 MED ORDER — CEFAZOLIN SODIUM-DEXTROSE 2-3 GM-% IV SOLR
INTRAVENOUS | Status: DC | PRN
  Administered 2015-04-07: 2 g via INTRAVENOUS

## 2015-04-07 MED ORDER — MEPERIDINE HCL 25 MG/ML IJ SOLN
INTRAMUSCULAR | Status: DC | PRN
  Administered 2015-04-07 (×2): 12.5 mg via INTRAVENOUS

## 2015-04-07 MED ORDER — MEPERIDINE HCL 25 MG/ML IJ SOLN
INTRAMUSCULAR | Status: AC
  Filled 2015-04-07: qty 1

## 2015-04-07 MED ORDER — OXYTOCIN 10 UNIT/ML IJ SOLN
INTRAMUSCULAR | Status: AC
  Filled 2015-04-07: qty 4

## 2015-04-07 MED ORDER — PHENYLEPHRINE 40 MCG/ML (10ML) SYRINGE FOR IV PUSH (FOR BLOOD PRESSURE SUPPORT)
PREFILLED_SYRINGE | INTRAVENOUS | Status: AC
  Filled 2015-04-07: qty 10

## 2015-04-07 MED ORDER — CEFAZOLIN SODIUM-DEXTROSE 2-3 GM-% IV SOLR
INTRAVENOUS | Status: AC
  Filled 2015-04-07: qty 50

## 2015-04-07 MED ORDER — LACTATED RINGERS IV SOLN: INTRAVENOUS | Status: DC

## 2015-04-07 MED ORDER — MORPHINE SULFATE 0.5 MG/ML IJ SOLN
INTRAMUSCULAR | Status: AC
  Filled 2015-04-07: qty 100

## 2015-04-07 MED ORDER — OXYTOCIN 10 UNIT/ML IJ SOLN
40.0000 [IU] | INTRAVENOUS | Status: DC | PRN
  Administered 2015-04-07: 40 [IU] via INTRAVENOUS

## 2015-04-07 MED ORDER — FENTANYL 2.5 MCG/ML BUPIVACAINE 1/10 % EPIDURAL INFUSION (WH - ANES)
14.0000 mL/h | INTRAMUSCULAR | Status: DC | PRN
Start: 2015-04-07 — End: 2015-04-07
  Administered 2015-04-07 (×2): 14 mL/h via EPIDURAL
  Filled 2015-04-07: qty 125

## 2015-04-07 MED ORDER — SENNOSIDES-DOCUSATE SODIUM 8.6-50 MG PO TABS
2.0000 | ORAL_TABLET | ORAL | Status: DC
  Administered 2015-04-08 (×2): 2 via ORAL
  Filled 2015-04-07 (×2): qty 2

## 2015-04-07 MED ORDER — TETANUS-DIPHTH-ACELL PERTUSSIS 5-2.5-18.5 LF-MCG/0.5 IM SUSP: 0.5000 mL | Freq: Once | INTRAMUSCULAR | Status: DC

## 2015-04-07 MED ORDER — SODIUM BICARBONATE 8.4 % IV SOLN
INTRAVENOUS | Status: AC
  Filled 2015-04-07: qty 50

## 2015-04-07 MED ORDER — CEFAZOLIN SODIUM-DEXTROSE 2-3 GM-% IV SOLR
2.0000 g | Freq: Once | INTRAVENOUS | Status: DC
  Filled 2015-04-07: qty 50

## 2015-04-07 SURGICAL SUPPLY — 33 items
BENZOIN TINCTURE PRP APPL 2/3 (GAUZE/BANDAGES/DRESSINGS) ×3 IMPLANT
CLAMP CORD UMBIL (MISCELLANEOUS) IMPLANT
CLOSURE WOUND 1/2 X4 (GAUZE/BANDAGES/DRESSINGS) ×1
CLOTH BEACON ORANGE TIMEOUT ST (SAFETY) ×3 IMPLANT
DRAIN JACKSON PRT FLT 7MM (DRAIN) IMPLANT
DRAPE SHEET LG 3/4 BI-LAMINATE (DRAPES) IMPLANT
DRSG OPSITE POSTOP 4X10 (GAUZE/BANDAGES/DRESSINGS) ×3 IMPLANT
DURAPREP 26ML APPLICATOR (WOUND CARE) ×3 IMPLANT
ELECT REM PT RETURN 9FT ADLT (ELECTROSURGICAL) ×3
ELECTRODE REM PT RTRN 9FT ADLT (ELECTROSURGICAL) ×1 IMPLANT
EVACUATOR SILICONE 100CC (DRAIN) IMPLANT
EXTRACTOR VACUUM M CUP 4 TUBE (SUCTIONS) IMPLANT
EXTRACTOR VACUUM M CUP 4' TUBE (SUCTIONS)
GLOVE BIO SURGEON STRL SZ7 (GLOVE) ×3 IMPLANT
GLOVE BIOGEL PI IND STRL 7.0 (GLOVE) ×1 IMPLANT
GLOVE BIOGEL PI INDICATOR 7.0 (GLOVE) ×2
GOWN STRL REUS W/TWL LRG LVL3 (GOWN DISPOSABLE) ×6 IMPLANT
KIT ABG SYR 3ML LUER SLIP (SYRINGE) IMPLANT
NEEDLE HYPO 25X5/8 SAFETYGLIDE (NEEDLE) ×3 IMPLANT
NS IRRIG 1000ML POUR BTL (IV SOLUTION) ×3 IMPLANT
PACK C SECTION WH (CUSTOM PROCEDURE TRAY) ×3 IMPLANT
PAD ABD 7.5X8 STRL (GAUZE/BANDAGES/DRESSINGS) ×3 IMPLANT
PAD OB MATERNITY 4.3X12.25 (PERSONAL CARE ITEMS) ×3 IMPLANT
PENCIL SMOKE EVAC W/HOLSTER (ELECTROSURGICAL) ×3 IMPLANT
RTRCTR C-SECT PINK 25CM LRG (MISCELLANEOUS) ×3 IMPLANT
SPONGE GAUZE 4X4 12PLY STER LF (GAUZE/BANDAGES/DRESSINGS) ×6 IMPLANT
STRIP CLOSURE SKIN 1/2X4 (GAUZE/BANDAGES/DRESSINGS) ×2 IMPLANT
SUT PLAIN 2 0 XLH (SUTURE) ×3 IMPLANT
SUT VIC AB 0 CTX 36 (SUTURE) ×12
SUT VIC AB 0 CTX36XBRD ANBCTRL (SUTURE) ×6 IMPLANT
SUT VIC AB 4-0 KS 27 (SUTURE) ×3 IMPLANT
TOWEL OR 17X24 6PK STRL BLUE (TOWEL DISPOSABLE) ×3 IMPLANT
TRAY FOLEY CATH SILVER 14FR (SET/KITS/TRAYS/PACK) ×3 IMPLANT

## 2015-04-07 NOTE — Progress Notes (Signed)
Faculty Practice OB/GYN Attending Note  Subjective:  Called to evaluate patient reporting very painful contractions; now considering RCS in lieu of continuing with TOLAC.  FHR reassuring, no LOF or vaginal bleeding. Good FM.   Admitted on 04/06/2015 for IOL for Variable fetal heart rate decelerations, antepartum during postdates NST.   Objective:  Blood pressure 103/70, pulse 99, temperature 98.1 F (36.7 C), temperature source Oral, resp. rate 16, height  (1.702 m), weight 226 lb (102.513 kg), last menstrual period 06/25/2014. FHT  Baseline 130 bpm, moderate variability, +accelerations, mild variable decelerations Toco: q2-3 mins Gen: NAD HENT: Normocephalic, atraumatic Lungs: Normal respiratory effort Heart: Regular rate noted Abdomen: N, gravid fundus, soft Cervix: Dilation: 1.5 Effacement (%): 50 Cervical Position: Posterior Station: -3 Presentation: Vertex Exam by:: Dr Alvester Morin Ext: 2+ DTRs, no edema, no cyanosis, negative Homan's sign  Assessment & Plan:  [name] at [redacted]w[redacted]d admitted for IOL/TOLAC for concerning postdates NST with variable decelerations. Considering RCS in lieu of TOLAC.  The risks of cesarean section discussed with the patient included but were not limited to: bleeding which may require transfusion or reoperation; infection which may require antibiotics; injury to bowel, bladder, ureters or other surrounding organs; injury to the fetus; need for additional procedures including hysterectomy in the event of a life-threatening hemorrhage; placental abnormalities wth subsequent pregnancies, incisional problems, thromboembolic phenomenon and other postoperative/anesthesia complications.    Recommended that patient obtain an epidural for now for pain; the earliest the procedure can be performed today by the oncoming team is around 1100 (time obtained after communication with OR team).  She agrees to the plan to get epidural now; Anesthesiology team informed.   Informed Dr. Penne Lash who is the oncoming attending. Patient defers signing consent form until she is more comfortable; she may change her mind and continue with TOLAC if comfortable.   Patient has been NPO since last night she will remain NPO for now.  Preoperative prophylactic antibiotics and SCDs ordered on call to the OR.  To OR when ready if patient still desires.this later.  Continue close observation on L&D for now until patient makes a  definitive decision about mode of delivery.   Jaynie Collins, MD, FACOG Attending Obstetrician & Gynecologist Faculty Practice, [facility]

## 2015-04-07 NOTE — Anesthesia Postprocedure Evaluation (Signed)
  Anesthesia Post-op Note  Patient: Web designer  Procedure(s) Performed: Procedure(s): CESAREAN SECTION (N/A)  Patient Location: PACU  Anesthesia Type:Epidural  Level of Consciousness: awake, alert  and oriented  Airway and Oxygen Therapy: Patient Spontanous Breathing  Post-op Pain: none  Post-op Assessment: Post-op Vital signs reviewed, Patient's Cardiovascular Status Stable, Respiratory Function Stable, Patent Airway, No signs of Nausea or vomiting, Pain level controlled, No headache, No backache, Spinal receding and Patient able to bend at knees LLE Motor Response: Purposeful movement LLE Sensation: Decreased RLE Motor Response: Purposeful movement RLE Sensation: Decreased L Sensory Level: S1-Sole of foot, small toes R Sensory Level: S1-Sole of foot, small toes  Post-op Vital Signs: Reviewed and stable  Last Vitals:  Filed Vitals:   04/07/15 1800  BP: 103/76  Pulse: 87  Temp:   Resp: 20    Complications: No apparent anesthesia complications

## 2015-04-07 NOTE — Transfer of Care (Signed)
Immediate Anesthesia Transfer of Care Note  Patient: Barbara Patterson  Procedure(s) Performed: Procedure(s): CESAREAN SECTION (N/A)  Patient Location: PACU  Anesthesia Type:Epidural  Level of Consciousness: awake, alert  and oriented  Airway & Oxygen Therapy: Patient Spontanous Breathing  Post-op Assessment: Report given to RN and Post -op Vital signs reviewed and stable  Post vital signs: Reviewed and stable  Last Vitals:  Filed Vitals:   04/07/15 1431  BP: 119/71  Pulse: 88  Temp:   Resp: 18    Complications: No apparent anesthesia complications

## 2015-04-07 NOTE — Anesthesia Procedure Notes (Signed)
Epidural Patient location during procedure: OB  Preanesthetic Checklist Completed: patient identified, site marked, surgical consent, pre-op evaluation, timeout performed, IV checked, risks and benefits discussed and monitors and equipment checked  Epidural Patient position: sitting Prep: site prepped and draped and DuraPrep Patient monitoring: continuous pulse ox and blood pressure Approach: midline Location: L3-L4 Injection technique: LOR saline  Needle:  Needle type: Tuohy  Needle gauge: 17 G Needle length: 9 cm and 9 Needle insertion depth: 7 cm Catheter type: closed end flexible Catheter size: 19 Gauge Catheter at skin depth: 12 cm Test dose: negative  Assessment Events: blood not aspirated, injection not painful, no injection resistance, negative IV test and no paresthesia  Additional Notes Patient identified. Risks/Benefits/Options discussed with patient including but not limited to bleeding, infection, nerve damage, paralysis, failed block, incomplete pain control, headache, blood pressure changes, nausea, vomiting, reactions to medication both or allergic, itching and postpartum back pain. Confirmed with bedside nurse the patient's most recent platelet count. Confirmed with patient that they are not currently taking any anticoagulation, have any bleeding history or any family history of bleeding disorders. Patient expressed understanding and wished to proceed. All questions were answered. Sterile technique was used throughout the entire procedure. Please see nursing notes for vital signs. Test dose was given through epidural catheter and negative prior to continuing to dose epidural or start infusion. Warning signs of high block given to the patient including shortness of breath, tingling/numbness in hands, complete motor block, or any concerning symptoms with instructions to call for help. Patient was given instructions on fall risk and not to get out of bed. All questions and  concerns addressed with instructions to call with any issues or inadequate analgesia.

## 2015-04-07 NOTE — Progress Notes (Signed)
Patient ID: Barbara Patterson, female   DOB: 10-18-1985, 29 y.o.   MRN: [id number] Pt elects to stop the labor process because she is tired.  She wants a repeat cesarean section.  The risks of cesarean section discussed with the patient included but were not limited to: bleeding which may require transfusion or reoperation; infection which may require antibiotics; injury to bowel, bladder, ureters or other surrounding organs; injury to the fetus; need for additional procedures including hysterectomy in the event of a life-threatening hemorrhage; placental abnormalities wth subsequent pregnancies, incisional problems, thromboembolic phenomenon and other postoperative/anesthesia complications. The patient concurred with the proposed plan, giving informed written consent for the procedure.   Ancef 2 gm IV SCDs OR notified.

## 2015-04-07 NOTE — Anesthesia Preprocedure Evaluation (Signed)
Anesthesia Evaluation  Patient identified by MRN, date of birth, ID band Patient awake    Reviewed: Allergy & Precautions, NPO status , Patient's Chart, lab work & pertinent test results  History of Anesthesia Complications Negative for: history of anesthetic complications  Airway Mallampati: II  TM Distance: >3 FB Neck ROM: Full    Dental no notable dental hx. (+) Dental Advisory Given   Pulmonary Current Smoker,  breath sounds clear to auscultation  Pulmonary exam normal       Cardiovascular negative cardio ROS Normal cardiovascular examRhythm:Regular Rate:Normal     Neuro/Psych PSYCHIATRIC DISORDERS Anxiety Depression negative neurological ROS     GI/Hepatic negative GI ROS, Neg liver ROS,   Endo/Other  obesity  Renal/GU negative Renal ROS  negative genitourinary   Musculoskeletal negative musculoskeletal ROS (+)   Abdominal   Peds negative pediatric ROS (+)  Hematology negative hematology ROS (+)   Anesthesia Other Findings   Reproductive/Obstetrics (+) Pregnancy TOLAC                             Anesthesia Physical Anesthesia Plan  ASA: II  Anesthesia Plan: Epidural   Post-op Pain Management:    Induction:   Airway Management Planned:   Additional Equipment:   Intra-op Plan:   Post-operative Plan:   Informed Consent: I have reviewed the patients History and Physical, chart, labs and discussed the procedure including the risks, benefits and alternatives for the proposed anesthesia with the patient or authorized representative who has indicated his/her understanding and acceptance.   Dental advisory given  Plan Discussed with: CRNA  Anesthesia Plan Comments:         Anesthesia Quick Evaluation

## 2015-04-07 NOTE — Anesthesia Preprocedure Evaluation (Addendum)
Anesthesia Evaluation  Patient identified by MRN, date of birth, ID band Patient awake    Reviewed: Allergy & Precautions, NPO status , Patient's Chart, lab work & pertinent test results  History of Anesthesia Complications Negative for: history of anesthetic complications  Airway Mallampati: II  TM Distance: >3 FB Neck ROM: Full    Dental no notable dental hx. (+) Dental Advisory Given   Pulmonary Current Smoker,  breath sounds clear to auscultation  Pulmonary exam normal       Cardiovascular negative cardio ROS Normal cardiovascular examRhythm:Regular Rate:Normal     Neuro/Psych PSYCHIATRIC DISORDERS Anxiety Depression negative neurological ROS     GI/Hepatic negative GI ROS, Neg liver ROS,   Endo/Other  obesity  Renal/GU negative Renal ROS  negative genitourinary   Musculoskeletal negative musculoskeletal ROS (+)   Abdominal   Peds negative pediatric ROS (+)  Hematology negative hematology ROS (+)   Anesthesia Other Findings   Reproductive/Obstetrics (+) Pregnancy TOLAC                            Anesthesia Physical Anesthesia Plan  ASA: II and emergent  Anesthesia Plan: Epidural   Post-op Pain Management:    Induction:   Airway Management Planned: Natural Airway  Additional Equipment:   Intra-op Plan:   Post-operative Plan:   Informed Consent: I have reviewed the patients History and Physical, chart, labs and discussed the procedure including the risks, benefits and alternatives for the proposed anesthesia with the patient or authorized representative who has indicated his/her understanding and acceptance.   Dental advisory given  Plan Discussed with: CRNA, Anesthesiologist and Surgeon  Anesthesia Plan Comments: (Patient for C/Section for failure to progress. Will use epidural for C/section. M. Malen Gauze, MD)       Anesthesia Quick Evaluation

## 2015-04-07 NOTE — Op Note (Signed)
Cesarean Section Operative Report  Barbara Patterson  04/06/2015 - 04/07/2015  Indications: maternal request, VBAC   Pre-operative Diagnosis: repeat cesarean section.   Post-operative Diagnosis: Same   Surgeon: Surgeon(s) and Role:    * Lesly Dukes, MD - Primary    * Kathrynn Running, MD - Fellow   Attending Attestation: I was present and scrubbed for the entire procedure.   Anesthesia: spinal   Estimated Blood Loss: 600 ml  Total IV Fluids: 3400 ml LR   Urine Output: 200 ml clear yellow urine  Baby condition / location:  Couplet care / Skin to Skin, APGARs 8/9  Complications: no complications  Indications: Barbara Patterson is a 29 y.o. G3P2011 with an IUP [redacted]w[redacted]d presenting for induction of labor for nonreassuring fetal heart tones in clinic. Induction begun. Approximately 8 hours after start of induction patient opted to cease induction stating she wanted a cesarean section and was very adamant about this.  The risks, benefits, complications, treatment options, and expected outcomes were discussed with the patient . The patient concurred with the proposed plan, giving informed consent. identified as Barbara Patterson and the procedure verified as C-Section Delivery.  Procedure Details: A Time Out was held and the above information confirmed.  The patient was taken back to the operative suite where spinal anesthesia was placed.  After induction of anesthesia, the patient was draped and prepped in the usual sterile manner and placed in a dorsal supine position with a leftward tilt. A transverse was made and carried down through the subcutaneous tissue to the fascia. Fascial incision was made and extended transversely. The fascia was separated from the underlying rectus tissue superiorly and inferiorly. The peritoneum was identified and entered. Peritoneal incision was extended longitudinally. A low transverse uterine incision was made. Delivered from cephalic presentation was a  viable female infant. Cord ph was not sent the umbilical cord was clamped and cut cord blood was obtained for evaluation. The placenta was removed Intact and appeared normal. The uterine outline, tubes and ovaries appeared normal}. The uterine incision was closed with running locked sutures of 0-Vicryl.   Hemostasis was observed. The fascia was then reapproximated with running sutures of 0Vicryl. The subcuticular closure was performed using 2-0 plain gut. The skin was closed with 4-0Vicryl.   Instrument, sponge, and needle counts were correct prior the abdominal closure and were correct at the conclusion of the case.     Disposition: PACU - hemodynamically stable.   Maternal Condition: stable       Signed: Lavonne Chick 04/07/2015 5:04 PM   Attestation of Attending Supervision of Fellow: Evaluation and management procedures were performed by the Fellow under my supervision and collaboration. I have reviewed the Fellow's note and chart, and I agree with the management and plan.

## 2015-04-08 ENCOUNTER — Encounter (HOSPITAL_COMMUNITY): Payer: Self-pay | Admitting: Obstetrics & Gynecology

## 2015-04-08 LAB — CBC
HCT: 26.6 % — ABNORMAL LOW (ref 36.0–46.0)
Hemoglobin: 8.6 g/dL — ABNORMAL LOW (ref 12.0–15.0)
MCH: 27.6 pg (ref 26.0–34.0)
MCHC: 32.3 g/dL (ref 30.0–36.0)
MCV: 85.3 fL (ref 78.0–100.0)
Platelets: 315 10*3/uL (ref 150–400)
RBC: 3.12 MIL/uL — ABNORMAL LOW (ref 3.87–5.11)
RDW: 14.5 % (ref 11.5–15.5)
WBC: 15.7 10*3/uL — ABNORMAL HIGH (ref 4.0–10.5)

## 2015-04-08 NOTE — Progress Notes (Signed)
POSTPARTUM PROGRESS NOTE  Post Partum Day 1  Subjective:  Barbara Patterson is a 29 y.o. [id number] [redacted]w[redacted]d s/p repeat LTCS.  No acute events overnight.  Pt denies problems with ambulating, voiding or po intake.  She denies nausea or vomiting.  Pain is well controlled.  She has had flatus. She has not had bowel movement.  Lochia Small.    Objective: Blood pressure 105/52, pulse 76, temperature 97.6 F (36.4 C), temperature source Axillary, resp. rate 18, height  (1.702 m), weight 226 lb (102.513 kg), last menstrual period 06/25/2014, SpO2 98 %, unknown if currently breastfeeding.  Physical Exam:  General: alert, cooperative and no distress Lochia:normal flow Chest: CTAB Heart: RRR no m/r/g Abdomen: +BS, soft, nontender,  Uterine Fundus: firm,  DVT Evaluation: No evidence of DVT seen on physical exam. Extremities: trace edema   Recent Labs  04/06/15 1700 04/08/15 0532  HGB 11.3* 8.6*  HCT 34.3* 26.6*    Assessment/Plan:  ASSESSMENT: Barbara Patterson is a 29 y.o. G3P2011 [redacted]w[redacted]d s/p rLTCS. H trended from 11.3 to 8.6. RPR has returned weakly positive, but treponemal testing is negative. RPR non reactive earlier in pregnancy. This likely represents a false positive.  Plan for discharge tomorrow or next day   LOS: 2 days   Silvano Bilis 04/08/2015, 11:59 AM

## 2015-04-08 NOTE — Progress Notes (Signed)
UR chart review completed.  

## 2015-04-08 NOTE — Addendum Note (Signed)
Addendum  created 04/08/15 0735 by Renford Dills, CRNA   Modules edited: Notes Section   Notes Section:  File: [id number]

## 2015-04-08 NOTE — Lactation Note (Signed)
This note was copied from the chart of Barbara Thatiana Renbarger. Lactation Consultation Note; Mom reports that baby is latching well but goes off to sleep after a few minutes, Reviewed unwrapping and undressing baby to awaken. Reviewed normal behavior the first 24 hours and then cluster feeding the second night. Baby asleep in bassinet at present and mom eating lunch- encouraged to call for assist when baby wakes for next feeding but mom states she knows how to do it. No questions at present. BF brochure given with resources for support after DC.    Patient Name: Barbara Patterson Date: 04/08/2015 Reason for consult: Initial assessment   Maternal Data Formula Feeding for Exclusion: No Does the patient have breastfeeding experience prior to this delivery?: No  Feeding Feeding Type: Breast Fed  LATCH Score/Interventions Latch: Too sleepy or reluctant, no latch achieved, no sucking elicited. Intervention(s): Skin to skin;Teach feeding cues;Waking techniques  Audible Swallowing: None Intervention(s): Skin to skin;Hand expression Intervention(s): Skin to skin;Hand expression  Type of Nipple: Everted at rest and after stimulation  Comfort (Breast/Nipple): Soft / non-tender     Hold (Positioning): Assistance needed to correctly position infant at breast and maintain latch.  LATCH Score: 5  Lactation Tools Discussed/Used     Consult Status Consult Status: Follow-up Date: 04/09/15 Follow-up type: In-patient    Barbara Patterson 04/08/2015, 1:15 PM

## 2015-04-08 NOTE — Anesthesia Postprocedure Evaluation (Signed)
  Anesthesia Post-op Note  Patient: Web designer  Procedure(s) Performed: Procedure(s): CESAREAN SECTION (N/A)  Patient Location: Mother/Baby  Anesthesia Type:Epidural  Level of Consciousness: awake  Airway and Oxygen Therapy: Patient Spontanous Breathing  Post-op Pain: mild  Post-op Assessment: Patient's Cardiovascular Status Stable and Respiratory Function Stable LLE Motor Response: Purposeful movement LLE Sensation: Decreased RLE Motor Response: Purposeful movement RLE Sensation: Decreased L Sensory Level: S1-Sole of foot, small toes R Sensory Level: S1-Sole of foot, small toes  Post-op Vital Signs: stable  Last Vitals:  Filed Vitals:   04/08/15 0633  BP: 100/58  Pulse: 62  Temp: 36.7 C  Resp: 18    Complications: No apparent anesthesia complications

## 2015-04-08 NOTE — Clinical Social Work Maternal (Signed)
CLINICAL SOCIAL WORK MATERNAL/CHILD NOTE  Patient Details  Name: Barbara Patterson MRN: [id number] Date of Birth: 11/20/1985  Date:  04/08/2015  Clinical Social Worker Initiating Note:  Loleta Books, LCSW Date/ Time Initiated:  04/08/15/1430     Child's Name:  Durenda Age   Legal Guardian:  Mother   Need for Interpreter:  None   Date of Referral:  04/08/15     Reason for Referral:  Current Substance Use/Substance Use During Pregnancy    Referral Source:  [facility]   Address:  [address] [place], [place] [address]  Phone number:  [phone]   Household Members:  Significant Other, Minor Children   Natural Supports (not living in the home):  Immediate Family, Extended Family   Professional Supports: None   Employment: Unemployed   Type of Work:   N/A  Education:    N/A  Surveyor, quantity Resources:  Medicaid   Other Resources:  [employer]   Cultural/Religious Considerations Which May Impact Care:  None reported  Strengths:  Ability to meet basic needs , Home prepared for child    Risk Factors/Current Problems:   1)Mental Health Concerns: MOB presents with history of depression and anxiety during the pregnancy. MOB never clarified exact symptoms, but reported that symptoms improved with her move to [place].  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 Infant's UDS is negative and MDS is pending.   Cognitive State:  Able to Concentrate , Alert , Goal Oriented , Linear Thinking    Mood/Affect:  Euthymic , Relaxed    CSW Assessment:  CSW received request for consult due to MOB presenting with a history of THC use.  Upon chart review, CSW also noted that MOB presents with history of anxiety and depression.  FOB's 2 sisters were in the room upon CSW arrival. CSW offered to return at a later time, but MOB provided consent for assessment to be completed in their presence.  MOB was a limited historian as she was difficult to  engage. She provided short and concise answers, and was guarded about her feelings.   Per MOB, she moved to [place] in June for a "new beginning". She denied any triggering events or push factors that lead to her move, but shared that she, her 61 year old son, and her fiance have now settled in [place]. MOB denied any lingering stressors associated with the move, and stated that her 29 year old has transitioned well to his new school. MOB stated that the FOB's family is in [place], and she identifies them as "positive" and "supportive".    MOB reported that she received a diagnosis of anxiety and depression in 2012. She did not clarify the events that led to the diagnosis, but shared that she has previously been prescribed Celexa, Buspar, and Seroquel.  MOB shared that she discontinued the medication when she learned that she was pregnant, and originally denied any increase/acute symptoms during the pregnancy. CSW inquired about note that was written on 6/22 that noted that MOB presented with increase in anxiety symptoms.  MOB presented as minimizing this note, and shared that her anxiety is no longer a current concern. She shared that since she and her family have moved to [facility], there have been no additional symptoms related to her mental health since she lives with positive and supportive people.   CSW provided education on perinatal mood and anxiety disorders. MOB acknowledged her increased risk due to her prior history, but she did  not identify her mental health as a current concern. MOB denied need or desire to re-start her previous medications at this time, but shared that she would speak to her family if symptoms occur. MOB reported that she will talk to "someone else" if needed, but was unable to identify a specific medical provider she would contact. MOB declined referral for mental health resources at this time, but agreed to contact her OB provider if she notes onset of postpartum  depression.   MOB reported THC use during the pregnancy, but was a vague historian related to her last use.  CSW provided education on the hospital drug screen policy. MOB denied questions or concerns, but never clarified if she felt that the infant's MDS will be positive or negative.    MOB denied questions, concerns, or needs at this time. She agreed to contact CSW if needs arise during the admission.   CSW Plan/Description:   1)Patient/Family Education: Perinatal mood and anxiety disorders, hospital drug screen policy  2) CSW to monitor infant's drug screens, and will make a CPS report if positive.  3)No Further Intervention Required/No Barriers to Discharge    Kelby Fam 04/08/2015, 2:05 PM

## 2015-04-09 DIAGNOSIS — Z98891 History of uterine scar from previous surgery: Secondary | ICD-10-CM

## 2015-04-09 MED ORDER — DOCUSATE SODIUM 100 MG PO CAPS
100.0000 mg | ORAL_CAPSULE | Freq: Two times a day (BID) | ORAL | Status: DC
Start: 2015-04-09 — End: 2015-05-21

## 2015-04-09 MED ORDER — OXYCODONE-ACETAMINOPHEN 5-325 MG PO TABS: 1.0000 | ORAL_TABLET | ORAL | Status: DC | PRN

## 2015-04-09 MED ORDER — IBUPROFEN 600 MG PO TABS: 600.0000 mg | ORAL_TABLET | Freq: Four times a day (QID) | ORAL | Status: AC | PRN

## 2015-04-09 NOTE — Discharge Instructions (Signed)

## 2015-04-09 NOTE — Discharge Summary (Signed)
Physician Discharge Summary  Patient ID: Barbara Patterson MRN: [id number] DOB/AGE: 1986/08/07 29 y.o.  Admit date: 04/06/2015 Discharge date: 04/09/2015  Admission Diagnoses: Induction of labor   Discharge Diagnoses:  Principal Problem:   Status post repeat low transverse cesarean section   Discharged Condition: good  Hospital Course:   Delivery Note -   Indications: maternal request, VBAC  Pre-operative Diagnosis: repeat cesarean section.  Post-operative Diagnosis: Same  Surgeon: Surgeon(s) and Role:  * Lesly Dukes, MD - Primary  * Kathrynn Running, MD - Fellow  Attending Attestation: I was present and scrubbed for the entire procedure.  Anesthesia: spinal Estimated Blood Loss: 600 ml Total IV Fluids: 3400 ml LR  Urine Output: 200 ml clear yellow urine Baby condition / location: Couplet care / Skin to Skin, APGARs 8/9 Complications: no complications  Indications: Barbara Patterson is a 29 y.o. G3P2011 with an IUP [redacted]w[redacted]d presenting for induction of labor for nonreassuring fetal heart tones in clinic. Induction begun. Approximately 8 hours after start of induction patient opted to cease induction stating she wanted a cesarean section and was very adamant about this. The risks, benefits, complications, treatment options, and expected outcomes were discussed with the patient . The patient concurred with the proposed plan, giving informed consent. identified as Barbara Patterson and the procedure verified as C-Section Delivery.  Procedure Details: A Time Out was held and the above information confirmed.  The patient was taken back to the operative suite where spinal anesthesia was placed. After induction of anesthesia, the patient was draped and prepped in the usual sterile manner and placed in a dorsal supine position with a leftward tilt. A transverse was made and carried down through the subcutaneous tissue to the fascia. Fascial incision was made and extended  transversely. The fascia was separated from the underlying rectus tissue superiorly and inferiorly. The peritoneum was identified and entered. Peritoneal incision was extended longitudinally. A low transverse uterine incision was made. Delivered from cephalic presentation was a viable female infant. Cord ph was not sent the umbilical cord was clamped and cut cord blood was obtained for evaluation. The placenta was removed Intact and appeared normal. The uterine outline, tubes and ovaries appeared normal}. The uterine incision was closed with running locked sutures of 0-Vicryl. Hemostasis was observed. The fascia was then reapproximated with running sutures of 0Vicryl. The subcuticular closure was performed using 2-0 plain gut. The skin was closed with 4-0Vicryl.  Instrument, sponge, and needle counts were correct prior the abdominal closure and were correct at the conclusion of the case.    Barbara Patterson is a 29 y.o. female G3P1011 with IUP at [redacted]w[redacted]d by LMP and 12 wk [place] presenting for IOL for non-reassuring NST with variable decelerations. Patient was admitted and required rLTCS. She has postpartum course that was uncomplicated including no problems with ambulating, PO intake, urination, pain, or bleeding. The pt feels ready to go home and will be discharged with outpatient follow-up.   Today: No acute events overnight. Pt denies problems with ambulating, voiding or po intake. She denies nausea or vomiting. Pain is moderately controlled. She has had flatus. She has had bowel movement. Lochia Small. Plan for birth control is outpatient Nexplanon. Method of Feeding: Breast  Consults: None  Significant Diagnostic Studies: labs: false negative RPR (RPR positive but treponemal negative)  Treatments: analgesia: epidural, acetaminophen and ibuprofen; Ancef  Discharge Exam: Blood pressure 117/71, pulse 71, temperature 97.6 F (36.4 C), temperature source Oral, resp. rate 18, height 5\' 7"  (1.702  m), weight 226 lb (102.513 kg), last menstrual period 06/25/2014, SpO2 98 %, unknown if currently breastfeeding. General appearance: alert, cooperative and no distress Resp: clear to auscultation bilaterally and non-labored Cardio: RRR, no murmurs appreciated GI: non-tender, +BS; uterus firm Extremities: no signs of DVT  Disposition: 01-Home or Self Care      Discharge Instructions    Activity as tolerated    Complete by:  As directed      Call MD for:  difficulty breathing, headache or visual disturbances    Complete by:  As directed      Call MD for:  extreme fatigue    Complete by:  As directed      Call MD for:  hives    Complete by:  As directed      Call MD for:  persistant dizziness or light-headedness    Complete by:  As directed      Call MD for:  persistant nausea and vomiting    Complete by:  As directed      Call MD for:  redness, tenderness, or signs of infection (pain, swelling, redness, odor or green/yellow discharge around incision site)    Complete by:  As directed      Call MD for:  severe uncontrolled pain    Complete by:  As directed      Call MD for:  temperature >100.4    Complete by:  As directed      Diet - low sodium heart healthy    Complete by:  As directed      Driving restriction     Complete by:  As directed   Avoid driving for at least 2 weeks.     Lifting restrictions    Complete by:  As directed   Weight restriction of 15 lbs.     Sexual acrtivity    Complete by:  As directed   Pelvic rest for six weeks (no tampons or sex)            Medication List    TAKE these medications        ibuprofen 600 MG tablet  Commonly known as:  ADVIL,MOTRIN  Take 1 tablet (600 mg total) by mouth every 6 (six) hours as needed for mild pain.     Prenatal Vitamins 28-0.8 MG Tabs  Take 1 tablet by mouth daily.       Follow-up Information    Follow up with [facility]. Schedule an appointment as soon as possible for a visit in 6 weeks.    Why:  For hospital follow-up   Contact information:   [address] [place] [address] [phone]      Signed: Tarri Abernethy 04/09/2015, 8:59 AM   I have seen and examined this patient and agree the above assessment.  Respiratory effort normal, lochia appropriate, legs negative,  pain level normal.  CRESENZO-DISHMAN,Raun Routh 04/09/2015 8:39 PM

## 2015-04-10 ENCOUNTER — Ambulatory Visit: Payer: Self-pay

## 2015-04-10 NOTE — Lactation Note (Signed)
This note was copied from the chart of Barbara Patterson. Lactation Consultation Note Mom was discharged yesterday. SHe called today to say that she was engorged and has trouble latching the baby.  She also reported she had latching difficulty in the hospital.  Engorgement relief reviewed and mom also referred to [facility].  She is aware that if latching difficulty continues she can call for an appointment.  Patient Name: Barbara Patterson Today's Date: 04/10/2015     Maternal Data    Feeding    LATCH Score/Interventions                      Lactation Tools Discussed/Used     Consult Status      Soyla Dryer 04/10/2015, 7:36 PM

## 2015-05-14 ENCOUNTER — Ambulatory Visit: Payer: Medicaid Other | Admitting: Obstetrics & Gynecology

## 2015-05-21 ENCOUNTER — Encounter: Payer: Self-pay | Admitting: Family Medicine

## 2015-05-21 ENCOUNTER — Ambulatory Visit (INDEPENDENT_AMBULATORY_CARE_PROVIDER_SITE_OTHER): Payer: Medicaid Other | Admitting: Family Medicine

## 2015-05-21 ENCOUNTER — Encounter: Payer: Self-pay | Admitting: *Deleted

## 2015-05-21 DIAGNOSIS — Z3202 Encounter for pregnancy test, result negative: Secondary | ICD-10-CM | POA: Diagnosis not present

## 2015-05-21 DIAGNOSIS — Z30430 Encounter for insertion of intrauterine contraceptive device: Secondary | ICD-10-CM

## 2015-05-21 LAB — POCT PREGNANCY, URINE: Preg Test, Ur: NEGATIVE

## 2015-05-21 MED ORDER — LEVONORGESTREL 18.6 MCG/DAY IU IUD
INTRAUTERINE_SYSTEM | Freq: Once | INTRAUTERINE | Status: AC
  Administered 2015-05-21: 13:00:00 via INTRAUTERINE

## 2015-05-21 NOTE — Addendum Note (Signed)
Addended by: Gerome ApleyZEYFANG, LINDA L on: 05/21/2015 12:38 PM   Modules accepted: Orders

## 2015-05-21 NOTE — Patient Instructions (Signed)

## 2015-05-21 NOTE — Progress Notes (Signed)
Patient ID: Barbara Patterson, female   DOB: 09/01/1985, 29 y.o.   MRN: [id number] Subjective:     Barbara Patterson is a 29 y.o. female who presents for a postpartum visit. She is 6 weeks postpartum following a low cervical transverse Cesarean section. I have fully reviewed the prenatal and intrapartum course. The delivery was at 40 gestational weeks. Outcome: primary cesarean section, low transverse incision. Anesthesia: spinal. Postpartum course has been normal. Baby's course has been normal. Baby is feeding by bottle - Similac Advance. Bleeding no bleeding. Bowel function is normal. Bladder function is normal. Patient is not sexually active. Contraception method is none. Postpartum depression screening: negative.  The following portions of the patient's history were reviewed and updated as appropriate: allergies, current medications, past family history, past medical history, past social history, past surgical history and problem list.  Review of Systems Pertinent items are noted in HPI.   Objective:    BP 116/75 mmHg  Pulse 74  Temp(Src) 98.5 F (36.9 C)  Ht 5\' 7"  (1.702 m)  Wt 218 lb 14.4 oz (99.292 kg)  BMI 34.28 kg/m2  Breastfeeding? No  General:  alert, cooperative and no distress     Lungs: clear to auscultation bilaterally  Heart:  regular rate and rhythm, S1, S2 normal, no murmur, click, rub or gallop  Abdomen: soft, non-tender; bowel sounds normal; no masses,  no organomegaly.  Incision clean, dry, intact   Vulva:  normal  Vagina: normal vagina, no discharge, exudate, lesion, or erythema  Cervix:  multiparous appearance        Assessment:     Normal postpartum exam. Pap smear not done at today's visit.   Plan:    1. Contraception: IUD - Liletta placed today 2. Follow up in: 1 month or as needed.     IUD Procedure Note Patient identified, informed consent performed, signed copy in chart, time out was performed.  Urine pregnancy test negative.  Speculum placed in the  vagina.  Cervix visualized.  Cleaned with Betadine x 2.  Grasped anteriorly with a single tooth tenaculum.  Uterus sounded to 8 cm.  Mirena IUD placed per manufacturer's recommendations.  Strings trimmed to 3 cm. Tenaculum was removed, good hemostasis noted.  Patient tolerated procedure well.   Patient given post procedure instructions and Mirena care card with expiration date.  Patient is asked to check IUD strings periodically and follow up in 4-6 weeks for IUD check.

## 2015-05-24 ENCOUNTER — Telehealth: Payer: Self-pay | Admitting: *Deleted

## 2015-05-24 NOTE — Telephone Encounter (Signed)
Received message left on nurse line on 05/24/15 at 0940.  Patient states she had a Mirena inserted on Friday.  States she needs "to know something important".  Requests a return call.

## 2015-05-25 NOTE — Telephone Encounter (Signed)
Called patient and left message that we are returning her call.  

## 2015-05-27 NOTE — Telephone Encounter (Signed)
Called patient and a female answered stating that she is not home. He will ask her to call us back if she still has a question.

## 2015-07-09 ENCOUNTER — Telehealth: Payer: Self-pay | Admitting: General Practice

## 2015-07-09 DIAGNOSIS — Z0000 Encounter for general adult medical examination without abnormal findings: Secondary | ICD-10-CM

## 2015-07-09 NOTE — Telephone Encounter (Signed)
Patient called and left message stating she had her mirena inserted 10/15 and has been bleeding off and on since then. Patient states bleeding has slowed down but she is concerned because the last time her iron was checked it was a 8.6. Patient requests call back. Called patient and she states that since insertion she has maybe stopped bleeding for only 3-4 days. Patient states concern over that much bleeding with her low iron levels. Recommended to patient she start an iron supplement at the store. Discussed with patient that we will need to call her back in regards to her bleeding as they are no current providers in the office. Patient verbalized understanding to both and states she needs a pcp. Referred patient to [facility] and provided her their information. Patient verbalized understanding & will await our call.

## 2015-07-14 NOTE — Telephone Encounter (Signed)
Spoke with Dr Debroah LoopArnold about patient who states bleeding pattern is normal but can offer patient CBC. Called patient and she states she has been spotting for the past couple of days but for now it has stopped. Reassured patient that irregular bleeding is normal following insertion of IUD but offered CBC to check iron levels if that would reassure her. Patient verbalized understanding and states she can come tomorrow 12/8 @ 330 for CBC. Patient had no questions

## 2015-07-15 ENCOUNTER — Other Ambulatory Visit: Payer: Self-pay

## 2015-07-15 DIAGNOSIS — D509 Iron deficiency anemia, unspecified: Secondary | ICD-10-CM

## 2015-07-15 LAB — CBC
HCT: 33.7 % — ABNORMAL LOW (ref 36.0–46.0)
Hemoglobin: 10.7 g/dL — ABNORMAL LOW (ref 12.0–15.0)
MCH: 24.5 pg — ABNORMAL LOW (ref 26.0–34.0)
MCHC: 31.8 g/dL (ref 30.0–36.0)
MCV: 77.3 fL — ABNORMAL LOW (ref 78.0–100.0)
MPV: 9.1 fL (ref 8.6–12.4)
Platelets: 491 10*3/uL — ABNORMAL HIGH (ref 150–400)
RBC: 4.36 MIL/uL (ref 3.87–5.11)
RDW: 14.8 % (ref 11.5–15.5)
WBC: 6.9 10*3/uL (ref 4.0–10.5)

## 2015-11-23 ENCOUNTER — Encounter (HOSPITAL_COMMUNITY): Payer: Self-pay | Admitting: Emergency Medicine

## 2015-11-23 ENCOUNTER — Emergency Department (HOSPITAL_COMMUNITY)
Admission: EM | Admit: 2015-11-23 | Discharge: 2015-11-23 | Disposition: A | Discharge status: Home / Self Care | Payer: Self-pay | Attending: Emergency Medicine | Admitting: Emergency Medicine

## 2015-11-23 DIAGNOSIS — Z8659 Personal history of other mental and behavioral disorders: Secondary | ICD-10-CM | POA: Insufficient documentation

## 2015-11-23 DIAGNOSIS — M545 Low back pain, unspecified: Secondary | ICD-10-CM

## 2015-11-23 DIAGNOSIS — F17200 Nicotine dependence, unspecified, uncomplicated: Secondary | ICD-10-CM | POA: Insufficient documentation

## 2015-11-23 MED ORDER — CYCLOBENZAPRINE HCL 10 MG PO TABS: 10.0000 mg | ORAL_TABLET | Freq: Two times a day (BID) | ORAL | Status: AC | PRN

## 2015-11-23 NOTE — ED Provider Notes (Signed)
History  By signing my name below, I, Barbara Patterson, attest that this documentation has been prepared under the direction and in the presence of Barbara Pili, PA-C. Electronically Signed: Karle Patterson, ED Scribe. 11/23/2015. 10:33 AM.  Chief Complaint  Patient presents with  . Back Pain   The history is provided by the patient and medical records. No language interpreter was used.    HPI Comments:  Barbara Patterson is a 30 y.o. female who presents to the Emergency Department complaining of sharp lower back pain that began last night while laying in the bed. She states she was playing with her 77 month old child when the pain began but she denies lifting him. She reports associated left and right sided soreness. Pt rates the pain at 7/10 and describes it as a dull ache. She has not taken anything for pain. She denies modifying factors. She denies CP, SOB, fever, chills, nausea, vomiting, dysuria, hematuria, bowel or bladder incontinence, saddle anesthesia, numbness, tingling or weakness of the lower extremities. She denies trauma, injury or fall. She is ambulatory without assistance. She does not have a PCP.  Past Medical History  Diagnosis Date  . Anxiety   . Depression    Past Surgical History  Procedure Laterality Date  . Cesarean section    . Cesarean section N/A 04/07/2015    Procedure: CESAREAN SECTION;  Surgeon: Lesly Dukes, MD;  Location: [facility];  Service: Obstetrics;  Laterality: N/A;   Family History  Problem Relation Age of Onset  . Diabetes Mother   . Kidney disease Mother   . Kidney disease Father   . Diabetes Sister   . Kidney disease Sister   . Kidney disease Brother   . Asthma Son   . Diabetes Maternal Grandmother   . Heart disease Paternal Grandmother    Social History  Substance Use Topics  . Smoking status: Current Some Day Smoker -- 0.25 packs/day for 15 years  . Smokeless tobacco: Current User  . Alcohol Use: No   OB History    Gravida Para Term  Preterm AB TAB SAB Ectopic Multiple Living   0 1  1  0 1     Review of Systems A complete 10 system review of systems was obtained and all systems are negative except as noted in the HPI and PMH.   Allergies  Review of patient's allergies indicates no known allergies.  Home Medications   Prior to Admission medications   Medication Sig Start Date End Date Taking? Authorizing Provider  cyclobenzaprine (FLEXERIL) 10 MG tablet Take 1 tablet (10 mg total) by mouth 2 (two) times daily as needed for muscle spasms. 11/23/15   Barbara Pili, PA-C  ibuprofen (ADVIL,MOTRIN) 600 MG tablet Take 1 tablet (600 mg total) by mouth every 6 (six) hours as needed for mild pain. Patient not taking: Reported on 05/21/2015 04/09/15   Barbara Saa, MD   Triage Vitals: BP 134/75 mmHg  Pulse 82  Temp(Src) 98 F (36.7 C) (Oral)  Resp 18  Ht  (1.676 m)  Wt 200 lb (90.719 kg)  BMI 32.30 kg/m2  SpO2 100%  Physical Exam  Constitutional: She is oriented to person, place, and time. She appears well-developed and well-nourished.  HENT:  Head: Normocephalic and atraumatic.  Eyes: EOM are normal.  Neck: Normal range of motion.  Cardiovascular: Normal rate and regular rhythm.   Pulmonary/Chest: Effort normal.  Abdominal: Soft.  Musculoskeletal: Normal range of motion.  Lumbar back: Normal.  No spinous process tenderness of lumbar spine. Able to ambulate. Full ROM  Neurological: She is alert and oriented to person, place, and time.  Skin: Skin is warm and dry.  Psychiatric: She has a normal mood and affect. Her behavior is normal. Thought content normal.  Nursing note and vitals reviewed.  ED Course  Procedures (including critical care time) DIAGNOSTIC STUDIES: Oxygen Saturation is 100% on RA, normal by my interpretation.   COORDINATION OF CARE: 10:31 AM- Will prescribe muscle relaxer and encouraged pt to take OTC NSAID. Will provide resources for pt to establish care with a PCP.  Pt verbalizes understanding and agrees to plan.  Medications - No data to display  Labs Review Labs Reviewed - No data to display  Imaging Review No results found. I have personally reviewed and evaluated these images and lab results as part of my medical decision-making.   EKG Interpretation None      MDM  I have reviewed the relevant previous healthcare records. I obtained HPI from historian.  ED Course:  Assessment: Patient with back pain. No neurological deficits appreciated. Patient is ambulatory. No warning symptoms of back pain including: fecal incontinence, urinary retention or overflow incontinence, night sweats, waking from sleep with back pain, unexplained fevers or weight loss, h/o cancer, IVDU, recent trauma. No concern for cauda equina, epidural abscess, or other serious cause of back pain. Conservative measures such as rest, ice/heat and pain medicine indicated with PCP follow-up if no improvement with conservative management.   Disposition/Plan:  DC Home Additional Verbal discharge instructions given and discussed with patient.  Pt Instructed to f/u with PCP in the next week for evaluation and treatment of symptoms. Return precautions given Pt acknowledges and agrees with plan  Supervising Physician Barbara Planan Floyd, DO   Final diagnoses:  Left-sided low back pain without sciatica    I personally performed the services described in this documentation, which was scribed in my presence. The recorded information has been reviewed and is accurate.   Barbara Piliyler Tre Sanker, PA-C 11/23/15 1037  Barbara Planan Floyd, DO 11/23/15 [phone]

## 2015-11-23 NOTE — ED Notes (Signed)
Pt sts left sided mid back pain starting last night; pt sts painful to move

## 2015-11-23 NOTE — ED Notes (Signed)
Started last pm with LBP while laying in bed. Pain increased with movement and position. Pt is currently on her period. Denies vaginal discharge prior to menses. Denies pain with urination.

## 2015-11-23 NOTE — Discharge Instructions (Signed)
Please read and follow all provided instructions.  Your diagnoses today include:  1. Left-sided low back pain without sciatica    Tests performed today include:  Vital signs - see below for your results today  Medications prescribed:   Take any prescribed medications only as directed.  Home care instructions:   Follow any educational materials contained in this packet  Please rest, use ice or heat on your back for the next several days  Do not lift, push, pull anything more than 10 pounds for the next week  Follow-up instructions: Please follow-up with your primary care provider in the next 1 week for further evaluation of your symptoms.   Return instructions:  SEEK IMMEDIATE MEDICAL ATTENTION IF YOU HAVE:  New numbness, tingling, weakness, or problem with the use of your arms or legs  Severe back pain not relieved with medications  Loss control of your bowels or bladder  Increasing pain in any areas of the body (such as chest or abdominal pain)  Shortness of breath, dizziness, or fainting.   Worsening nausea (feeling sick to your stomach), vomiting, fever, or sweats  Any other emergent concerns regarding your health   Additional Information:  Your vital signs today were: BP 134/75 mmHg   Pulse 82   Temp(Src) 98 F (36.7 C) (Oral)   Resp 18   Ht 5\' 6"  (1.676 m)   Wt 90.719 kg   BMI 32.30 kg/m2   SpO2 100% If your blood pressure (BP) was elevated above 135/85 this visit, please have this repeated by your doctor within one month. --------------

## 2015-12-30 ENCOUNTER — Emergency Department (HOSPITAL_COMMUNITY)
Admission: EM | Admit: 2015-12-30 | Discharge: 2015-12-30 | Disposition: A | Discharge status: Home / Self Care | Payer: Medicaid Other | Attending: Emergency Medicine | Admitting: Emergency Medicine

## 2015-12-30 ENCOUNTER — Encounter (HOSPITAL_COMMUNITY): Payer: Self-pay | Admitting: Nurse Practitioner

## 2015-12-30 DIAGNOSIS — K0889 Other specified disorders of teeth and supporting structures: Secondary | ICD-10-CM

## 2015-12-30 DIAGNOSIS — K002 Abnormalities of size and form of teeth: Secondary | ICD-10-CM | POA: Insufficient documentation

## 2015-12-30 DIAGNOSIS — Z8659 Personal history of other mental and behavioral disorders: Secondary | ICD-10-CM | POA: Insufficient documentation

## 2015-12-30 DIAGNOSIS — F17200 Nicotine dependence, unspecified, uncomplicated: Secondary | ICD-10-CM | POA: Insufficient documentation

## 2015-12-30 DIAGNOSIS — K1370 Unspecified lesions of oral mucosa: Secondary | ICD-10-CM | POA: Insufficient documentation

## 2015-12-30 DIAGNOSIS — K029 Dental caries, unspecified: Secondary | ICD-10-CM | POA: Insufficient documentation

## 2015-12-30 NOTE — ED Provider Notes (Signed)
CSN: [id number]     Arrival date & time 12/30/15  1145 History  By signing my name below, I, Barbara Patterson, attest that this documentation has been prepared under the direction and in the presence of Barbara Berry, PA-C. Electronically Signed: Tanda Patterson, ED Scribe. 12/30/2015. 12:31 PM.  Chief Complaint  Patient presents with  . Dental Problem   The history is provided by the patient. No language interpreter was used.    HPI Comments:  Barbara Patterson is a 30 y.o. female who presents to the Emergency Department complaining of 1d of a small, red appearing bump on her right inside cheek.  She denies pain, swelling, drainage.  She did not have any injury to her cheek.  No history of similar.  She showed her boss at work and it was still present, so her boss she suggested she leave work and get it evaluated.  The pt states that it was still present when arriving to the ER.  The small bump is not tender.  She denies difficulty eating,  Breathing, no trouble swallowing.  No fevers, chills, swollen lymph nodes.  Patient does have poor dentition and is new to the area, does not have a dentist. She has an upper left molar that is cracked and decayed down to the gumline that she states gives her pain intermittently, currently no pain or swelling   Past Medical History  Diagnosis Date  . Anxiety   . Depression    Past Surgical History  Procedure Laterality Date  . Cesarean section    . Cesarean section N/A 04/07/2015    Procedure: CESAREAN SECTION;  Surgeon: Lesly Dukes, MD;  Location: [facility];  Service: Obstetrics;  Laterality: N/A;   Family History  Problem Relation Age of Onset  . Diabetes Mother   . Kidney disease Mother   . Kidney disease Father   . Diabetes Sister   . Kidney disease Sister   . Kidney disease Brother   . Asthma Son   . Diabetes Maternal Grandmother   . Heart disease Paternal Grandmother    Social History  Substance Use Topics  . Smoking status: Current Some Day  Smoker -- 0.25 packs/day for 15 years  . Smokeless tobacco: Current User  . Alcohol Use: No   OB History    Gravida Para Term Preterm AB TAB SAB Ectopic Multiple Living   0 1  1  0 1     Review of Systems  Constitutional: Negative for fever.  HENT: Positive for dental problem and mouth sores. Negative for trouble swallowing.   Respiratory: Negative for shortness of breath.   All other systems reviewed and are negative.  Allergies  Review of patient's allergies indicates no known allergies.  Home Medications   Prior to Admission medications   Medication Sig Start Date End Date Taking? Authorizing Provider  cyclobenzaprine (FLEXERIL) 10 MG tablet Take 1 tablet (10 mg total) by mouth 2 (two) times daily as needed for muscle spasms. 11/23/15   [name], PA-C  ibuprofen (ADVIL,MOTRIN) 600 MG tablet Take 1 tablet (600 mg total) by mouth every 6 (six) hours as needed for mild pain. Patient not taking: Reported on 05/21/2015 04/09/15   Marquette Saa, MD   BP 118/79 mmHg  Pulse 96  Temp(Src) 98.6 F (37 C) (Oral)  Resp 17  SpO2 100% Physical Exam  Constitutional: She is oriented to person, place, and time. She appears well-developed and well-nourished. No distress.  HENT:  Head: Normocephalic and atraumatic.  Nose: Nose normal.  Mouth/Throat: Uvula is midline, oropharynx is clear and moist and mucous membranes are normal. Mucous membranes are not pale and not dry. No trismus in the jaw. Abnormal dentition. Dental caries present. No dental abscesses or uvula swelling. No oropharyngeal exudate.    Normal appearing buccal mucosa  Eyes: Conjunctivae and EOM are normal.  Neck: Normal range of motion. Neck supple. No tracheal deviation present.  Cardiovascular: Normal rate.   Pulmonary/Chest: Effort normal. No stridor. No respiratory distress.  Musculoskeletal: Normal range of motion.  Lymphadenopathy:    She has no cervical adenopathy.  Neurological: She is alert and  oriented to person, place, and time.  Skin: Skin is warm and dry.  Psychiatric: She has a normal mood and affect. Her behavior is normal.  Nursing note and vitals reviewed.   ED Course  Procedures (including critical care time) DIAGNOSTIC STUDIES: Oxygen Saturation is 100% on RA, normal by my interpretation.    COORDINATION OF CARE: 12:31 PM-Discussed treatment plan which includes dentist referral with pt at bedside and pt agreed to plan.    MDM   Patient presents to emergency department with complaint of a lesion on the inside of her right cheek, at time of evaluation oral mucosa is normal in appearance without any lesions. All mucosa is pink, no area of redness, no vesicles or papules. She does have poor dentition throughout including left upper molar which is severely decayed.  No sublingual tenderness, airway intact, no trismus, uvula midline, no swollen or tender lymph nodes.  Molar not appear acutely infected. Encourage dental follow-up, on-call dentist information given with dental resource guide.   Patient safe to discharge home, appears to be in good condition, vital signs stable, no emergent medical condition. Final diagnoses:  Pain, dental  Oral mucosal lesion          Barbara BerryLeisa Islam Eichinger, PA-C 12/30/15 1408  Benjiman CoreNathan Pickering, MD 12/30/15 1459

## 2015-12-30 NOTE — ED Notes (Signed)
She c/o non-painful sore to inside of R cheek she noticed today. Denies any other complaints

## 2015-12-30 NOTE — Discharge Instructions (Signed)
Dental Pain °Dental pain may be caused by many things, including: °· Tooth decay (cavities or caries). Cavities expose the nerve of your tooth to air and hot or cold temperatures. This can cause pain or discomfort. °· Abscess or infection. A dental abscess is a collection of infected pus from a bacterial infection in the inner part of the tooth (pulp). It usually occurs at the end of the tooth's root. °· Injury. °· An unknown reason (idiopathic). °Your pain may be mild or severe. It may only occur when: °· You are chewing. °· You are exposed to hot or cold temperature. °· You are eating or drinking sugary foods or beverages, such as soda or candy. °Your pain may also be constant. °HOME CARE INSTRUCTIONS °Watch your dental pain for any changes. The following actions may help to lessen any discomfort that you are feeling: °· Take medicines only as directed by your dentist. °· If you were prescribed an antibiotic medicine, finish all of it even if you start to feel better. °· Keep all follow-up visits as directed by your dentist. This is important. °· Do not apply heat to the outside of your face. °· Rinse your mouth or gargle with salt water if directed by your dentist. This helps with pain and swelling. °¨ You can make salt water by adding ¼ tsp of salt to 1 cup of warm water. °· Apply ice to the painful area of your face: °¨ Put ice in a plastic bag. °¨ Place a towel between your skin and the bag. °¨ Leave the ice on for 20 minutes, 2-3 times per day. °· Avoid foods or drinks that cause you pain, such as: °¨ Very hot or very cold foods or drinks. °¨ Sweet or sugary foods or drinks. °SEEK MEDICAL CARE IF: °· Your pain is not controlled with medicines. °· Your symptoms are worse. °· You have new symptoms. °SEEK IMMEDIATE MEDICAL CARE IF: °· You are unable to open your mouth. °· You are having trouble breathing or swallowing. °· You have a fever. °· Your face, neck, or jaw is swollen. °  °This information is not  intended to replace advice given to you by your health care provider. Make sure you discuss any questions you have with your health care provider. °  °Document Released: 07/24/2005 Document Revised: 12/08/2014 Document Reviewed: 07/20/2014 °Elsevier Interactive Patient Education ©2016 [employer] ° °Community Resource Guide Dental °The [employer]’s “211” is a great source of information about community services available.  Access by dialing 2-1-1 from anywhere in [place], or by website -  www.nc211.org.  ° °Other Local Resources (Updated 08/2015) ° °Dental  Care °  °Services ° °  °Phone Number and Address  °Cost  ° [facility] For children 0 - 21 years of age:  °• Cleaning °• Tooth brushing/flossing instruction °• Sealants, fillings, crowns °• Extractions °• Emergency treatment  [phone] °[address] °[place], [place] [address] Charges based on family income.  Medicaid and some insurance plans accepted.   °  °[facility] [place] • Cleaning °• Sealants, fillings, crowns °• Extractions °• Emergency treatment [phone] °[address] °[place], [place] ° Pregnant women 18 years of age or older with a Medicaid card  °[facility] [place] • Cleaning °• Sealants, fillings, crowns °• Extractions °• Emergency treatment [phone] °[address] °[place], [place] Pregnant women 18 years of age or older with a   Medicaid card  °[facility] For children 0 - 21 years of age:  °• Cleaning °• Tooth brushing/flossing instruction °• Sealants, fillings, crowns °• Extractions °• Emergency treatment °Limited orthodontic services for patients with Medicaid [phone] °[address] °[place], [place] [address] Medicaid and [place] Health Choice cover for children up to age 21 and pregnant women.  Parents of children up to age 21 without Medicaid pay a  reduced fee at time of service.  °[facility] [place] For children 0 - 21 years of age:  °• Cleaning °• Tooth brushing/flossing instruction °• Sealants, fillings, crowns °• Extractions °• Emergency treatment °Limited orthodontic services for patients with Medicaid [phone] °[address] °[place], [place].  Medicaid and [place] Health Choice cover for children up to age 21 and pregnant women.  Parents of children up to age 21 without Medicaid pay a reduced fee.  °[facility] • Cleaning °• Sealants, fillings, crowns °• Extractions ° °Hours: Tuesdays and Thursdays, 4:15 - 8 pm [phone] °[address] °[place], [place] [address] Services free of charge to [place] residents ages 18-64 who do not have health insurance, Medicare, Medicaid, or VA benefits and fall within federal poverty guidelines  °[facility] ° ° ° Provides dental care in addition to primary medical care, nutritional counseling, and pharmacy: °• Cleaning °• Sealants, fillings, crowns °• Extractions ° ° ° ° ° ° ° ° ° ° ° ° ° ° ° ° ° [phone] °[facility], [address] °[place], [place] ° °[phone] °[facility], [address] [place], [place] ° °[phone] °[facility] °[place], [place] ° °[phone] °[facility], [address] °[place], [place] ° °[phone] °[facility] °[address] °[place], [place] Accepts Medicaid, Medicare, most insurance.  Also provides services available to all with fees adjusted based on ability to pay.    °[facility] • Cleaning °• Tooth brushing/flossing instruction °• Sealants, fillings, crowns °• Extractions °• Emergency treatment °Hours: Tuesdays, Thursdays, and Fridays from 8 am to 5 pm by appointment only. [phone] °[address] °[place], [place]  [address] [place] residents with Medicaid (depending on eligibility) and children with [place] Health Choice - call for more information.  °[facility] • Extractions only ° °Hours: 2nd and 4th Thursday of each month from 6:30 am - 9 am.   [phone]. [phone] °[address] °[place],  [address] Ages 18 and older only.  Patients are seen on a first come, first served basis.  °[facility] • Cleanings °• Fillings °• Extractions °• Orthodontics °• Endodontics °• Implants/Crowns/Bridges °• Complete and partial dentures [phone] °[place],  Patients must complete an application for services.  There is often a waiting list.   ° °
